# Patient Record
Sex: Female | Born: 2000 | Hispanic: No | Marital: Single | Smoking: Never smoker
Health system: Southern US, Community
[De-identification: ages and names within clinical notes are randomized; demographics above are authoritative.]

## PROBLEM LIST (undated history)

## (undated) DIAGNOSIS — J45909 Unspecified asthma, uncomplicated: Secondary | ICD-10-CM

## (undated) HISTORY — DX: Unspecified asthma, uncomplicated: J45.909

## (undated) HISTORY — PX: OTHER SURGICAL HISTORY: SHX169

---
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
## 2021-01-26 ENCOUNTER — Other Ambulatory Visit: Payer: Self-pay

## 2021-01-26 ENCOUNTER — Ambulatory Visit (INDEPENDENT_AMBULATORY_CARE_PROVIDER_SITE_OTHER): Payer: 59 | Admitting: Family Medicine

## 2021-01-26 ENCOUNTER — Encounter: Payer: Self-pay | Admitting: Family Medicine

## 2021-01-26 VITALS — BP 94/68 | HR 94 | Temp 98.0°F | Resp 16

## 2021-01-26 DIAGNOSIS — H1013 Acute atopic conjunctivitis, bilateral: Secondary | ICD-10-CM

## 2021-01-26 DIAGNOSIS — K219 Gastro-esophageal reflux disease without esophagitis: Secondary | ICD-10-CM | POA: Diagnosis not present

## 2021-01-26 DIAGNOSIS — J4540 Moderate persistent asthma, uncomplicated: Secondary | ICD-10-CM

## 2021-01-26 DIAGNOSIS — J302 Other seasonal allergic rhinitis: Secondary | ICD-10-CM

## 2021-01-26 DIAGNOSIS — J3089 Other allergic rhinitis: Secondary | ICD-10-CM | POA: Diagnosis not present

## 2021-01-26 DIAGNOSIS — H1010 Acute atopic conjunctivitis, unspecified eye: Secondary | ICD-10-CM

## 2021-01-26 MED ORDER — OMEPRAZOLE 20 MG PO CPDR: 20.0000 mg | DELAYED_RELEASE_CAPSULE | Freq: Every day | ORAL | 5 refills | Status: DC

## 2021-01-26 NOTE — Progress Notes (Signed)
[address] [place] [address] Dept: [phone]  FOLLOW UP NOTE  Patient ID: Christine Diaz, female    DOB: Jul 11, 2000  Age: 20 y.o. MRN: [id number] Date of Office Visit: 01/26/2021  Assessment  Chief Complaint: Sinusitis (Cat and dog exposure flaring allergies. Sinus pressure, sinus headache x 2 days. )  HPI Christine Diaz is a 20 year old female who presents to the clinic for evaluation of acute sinus issues.  She was last seen in this clinic on 10/26/2020 by Dr. Delorse Lek for evaluation of allergic rhinitis and asthma requiring a prednisone taper for relief of symptoms.  At today's visit, she reports that she returned home from college at [employer] on January 08, 2021 where her parents have pets in the home which aggravates her allergies and asthma.  She reports her asthma has been moderately well controlled with no shortness of breath or wheeze with activity or rest.  She does report occasional cough occurring in the daytime as well as overnight which is a mix of dry and producing clear mucus.  She continues montelukast 10 mg once a day and uses Symbicort 160 intermittently.  She has not used albuterol since her last visit to this clinic.  Allergic rhinitis is reported as poorly controlled with symptoms beginning shortly after she returned to her parents home including clear rhinorrhea, nasal congestion, sneezing, and postnasal drainage.  She continues a daily antihistamine, Flonase, and nasal saline rinses.  She does report headache above her left eye which resolved after taking an over-the-counter sinus medication.  Her last environmental allergy skin testing was on 02/19/2018 and was positive to grass pollen, weed pollen, tree pollen, cat, dog, and dust mites.  She is interested in possibly pursuing allergen immunotherapy.  She denies fever, sweats, chills, sore throat, and sick contacts.  Reflux is reported as poorly controlled with heartburn occurring most nights of the week.  She  reports that she had previously taken Prilosec with good control of reflux, however, is not currently taking this medication.  Her current medications are listed in the chart.   Drug Allergies:  No Known Allergies  Physical Exam: BP 94/68    Pulse 94    Temp 98 F (36.7 C) (Temporal)    Resp 16    SpO2 98%    Physical Exam Vitals reviewed.  Constitutional:      Appearance: Normal appearance.  HENT:     Head: Normocephalic and atraumatic.     Right Ear: Tympanic membrane normal.     Left Ear: Tympanic membrane normal.     Nose:     Comments: Bilateral nares slightly erythematous with clear nasal drainage noted.  Pharynx erythematous with no exudate.  Tonsils 3+ with no exudate.  Ears normal.  Eyes normal. Neurological:     Mental Status: She is alert.    Diagnostics: FVC 4.45, FEV1 4.00.  Predicted FVC 4.34, predicted FEV1 3.79.  Spirometry indicates normal ventilatory function.  Assessment and Plan: 1. Moderate persistent asthma without complication   2. Seasonal and perennial allergic rhinitis   3. Seasonal allergic conjunctivitis   4. Gastroesophageal reflux disease, unspecified whether esophagitis present     Meds ordered this encounter  Medications   omeprazole (PRILOSEC) 20 MG capsule    Sig: Take 1 capsule (20 mg total) by mouth daily.    Dispense:  30 capsule    Refill:  5    Patient Instructions  Asthma Continue Symbicort 160-2 puffs twice a day with a spacer  to prevent cough or wheeze Continue montelukast 10 mg once a day to help prevent cough and wheeze. Continue albuterol 2 puffs every 4 hours as needed for cough or wheeze You may use albuterol 2 puffs 5 to 15 minutes prior to exercise to decrease cough or wheeze  Allergic rhinitis Stop taking Zyrtec for the next 5 days.  Then continue Zyrtec 10 mg once a day for runny nose or itchy eyes. Remember to rotate to a different antihistamine about every 3 months. Some examples of over the counter antihistamines  include Zyrtec (cetirizine), Xyzal (levocetirizine), Allegra (fexofenadine), and Claritin (loratidine).  Prednisone 10 mg tablets. Take 2 tablets once a day for 4 days, then take 1 tablet on the 5th day, then stop.  Call the clinic with any worsening symptoms or if you develop a fever Continue fluticasone nasal spray doing 2 sprays each nostril once a day as needed for stuffy nose.  In the right nostril, point the applicator out toward the right ear. In the left nostril, point the applicator out toward the left ear Continue saline nasal rinses as needed for nasal symptoms. Use this before any medicated nasal sprays for best result If your medications do not control your symptoms consider a course of allergen immunotherapy.  Written information about allergy injections provided.  Call the clinic if you are interested in beginning allergen immunotherapy  Allergic conjunctivitis Some over the counter eye drops include Pataday one drop in each eye once a day as needed for red, itchy eyes OR Zaditor one drop in each eye twice a day as needed for red itchy eyes.  Reflux Continue dietary lifestyle modifications as listed below Begin omeprazole 20 mg once a day to prevent reflux or heartburn  Call the clinic if this treatment plan is not working well for you  Follow up in 3 months or sooner if needed.   Return in about 3 months (around 04/26/2021), or if symptoms worsen or fail to improve.    Thank you for the opportunity to care for this patient.  Please do not hesitate to contact me with questions.  Thermon Leyland, FNP [facility]

## 2021-01-26 NOTE — Patient Instructions (Addendum)
Asthma Continue Symbicort 160-2 puffs twice a day with a spacer to prevent cough or wheeze Continue montelukast 10 mg once a day to help prevent cough and wheeze. Continue albuterol 2 puffs every 4 hours as needed for cough or wheeze You may use albuterol 2 puffs 5 to 15 minutes prior to exercise to decrease cough or wheeze  Allergic rhinitis Stop taking Zyrtec for the next 5 days.  Then continue Zyrtec 10 mg once a day for runny nose or itchy eyes. Remember to rotate to a different antihistamine about every 3 months. Some examples of over the counter antihistamines include Zyrtec (cetirizine), Xyzal (levocetirizine), Allegra (fexofenadine), and Claritin (loratidine).  Prednisone 10 mg tablets. Take 2 tablets once a day for 4 days, then take 1 tablet on the 5th day, then stop.  Call the clinic with any worsening symptoms or if you develop a fever Continue fluticasone nasal spray doing 2 sprays each nostril once a day as needed for stuffy nose.  In the right nostril, point the applicator out toward the right ear. In the left nostril, point the applicator out toward the left ear Continue saline nasal rinses as needed for nasal symptoms. Use this before any medicated nasal sprays for best result If your medications do not control your symptoms consider a course of allergen immunotherapy.  Written information about allergy injections provided.  Call the clinic if you are interested in beginning allergen immunotherapy  Allergic conjunctivitis Some over the counter eye drops include Pataday one drop in each eye once a day as needed for red, itchy eyes OR Zaditor one drop in each eye twice a day as needed for red itchy eyes.  Reflux Continue dietary lifestyle modifications as listed below Begin omeprazole 20 mg once a day to prevent reflux or heartburn  Call the clinic if this treatment plan is not working well for you  Follow up in 3 months or sooner if needed.   Lifestyle Changes for Controlling  GERD When you have GERD, stomach acid feels as if its backing up toward your mouth. Whether or not you take medication to control your GERD, your symptoms can often be improved with lifestyle changes.   Raise Your Head Reflux is more likely to strike when youre lying down flat, because stomach fluid can flow backward more easily. Raising the head of your bed 4-6 inches can help. To do this: Slide blocks or books under the legs at the head of your bed. Or, place a wedge under the mattress. Many foam stores can make a suitable wedge for you. The wedge should run from your waist to the top of your head. Dont just prop your head on several pillows. This increases pressure on your stomach. It can make GERD worse.  Watch Your Eating Habits Certain foods may increase the acid in your stomach or relax the lower esophageal sphincter, making GERD more likely. Its best to avoid the following: Coffee, tea, and carbonated drinks (with and without caffeine) Fatty, fried, or spicy food Mint, chocolate, onions, and tomatoes Any other foods that seem to irritate your stomach or cause you pain  Relieve the Pressure Eat smaller meals, even if you have to eat more often. Dont lie down right after you eat. Wait a few hours for your stomach to empty. Avoid tight belts and tight-fitting clothes. Lose excess weight.  Tobacco and Alcohol Avoid smoking tobacco and drinking alcohol. They can make GERD symptoms worse.  Reducing Pollen Exposure The American Academy of Allergy,  Asthma and Immunology suggests the following steps to reduce your exposure to pollen during allergy seasons. Do not hang sheets or clothing out to dry; pollen may collect on these items. Do not mow lawns or spend time around freshly cut grass; mowing stirs up pollen. Keep windows closed at night.  Keep car windows closed while driving. Minimize morning activities outdoors, a time when pollen counts are usually at their  highest. Stay indoors as much as possible when pollen counts or humidity is high and on windy days when pollen tends to remain in the air longer. Use air conditioning when possible.  Many air conditioners have filters that trap the pollen spores. Use a HEPA room air filter to remove pollen form the indoor air you breathe.  Control of Dog or Cat Allergen Avoidance is the best way to manage a dog or cat allergy. If you have a dog or cat and are allergic to dog or cats, consider removing the dog or cat from the home. If you have a dog or cat but dont want to find it a new home, or if your family wants a pet even though someone in the household is allergic, here are some strategies that may help keep symptoms at bay:  Keep the pet out of your bedroom and restrict it to only a few rooms. Be advised that keeping the dog or cat in only one room will not limit the allergens to that room. Dont pet, hug or kiss the dog or cat; if you do, wash your hands with soap and water. High-efficiency particulate air (HEPA) cleaners run continuously in a bedroom or living room can reduce allergen levels over time. Regular use of a high-efficiency vacuum cleaner or a central vacuum can reduce allergen levels. Giving your dog or cat a bath at least once a week can reduce airborne allergen.  Control of Mold Allergen Mold and fungi can grow on a variety of surfaces provided certain temperature and moisture conditions exist.  Outdoor molds grow on plants, decaying vegetation and soil.  The major outdoor mold, Alternaria and Cladosporium, are found in very high numbers during hot and dry conditions.  Generally, a late Summer - Fall peak is seen for common outdoor fungal spores.  Rain will temporarily lower outdoor mold spore count, but counts rise rapidly when the rainy period ends.  The most important indoor molds are Aspergillus and Penicillium.  Dark, humid and poorly ventilated basements are ideal sites for mold growth.   The next most common sites of mold growth are the bathroom and the kitchen.  Outdoor [employer] Use air conditioning and keep windows closed Avoid exposure to decaying vegetation. Avoid leaf raking. Avoid grain handling. Consider wearing a face mask if working in moldy areas.  Indoor Mold Control Maintain humidity below 50%. Clean washable surfaces with 5% bleach solution. Remove sources e.g. Contaminated carpets.

## 2021-02-05 ENCOUNTER — Encounter: Payer: Self-pay | Admitting: Family Medicine

## 2021-02-05 ENCOUNTER — Ambulatory Visit (INDEPENDENT_AMBULATORY_CARE_PROVIDER_SITE_OTHER): Payer: 59 | Admitting: Family Medicine

## 2021-02-05 ENCOUNTER — Other Ambulatory Visit: Payer: Self-pay

## 2021-02-05 VITALS — BP 114/80 | HR 122 | Temp 98.2°F | Resp 20 | Ht 68.0 in | Wt 161.8 lb

## 2021-02-05 DIAGNOSIS — H1013 Acute atopic conjunctivitis, bilateral: Secondary | ICD-10-CM

## 2021-02-05 DIAGNOSIS — J3089 Other allergic rhinitis: Secondary | ICD-10-CM | POA: Diagnosis not present

## 2021-02-05 DIAGNOSIS — J0110 Acute frontal sinusitis, unspecified: Secondary | ICD-10-CM

## 2021-02-05 DIAGNOSIS — H1010 Acute atopic conjunctivitis, unspecified eye: Secondary | ICD-10-CM

## 2021-02-05 DIAGNOSIS — J4540 Moderate persistent asthma, uncomplicated: Secondary | ICD-10-CM | POA: Diagnosis not present

## 2021-02-05 DIAGNOSIS — K219 Gastro-esophageal reflux disease without esophagitis: Secondary | ICD-10-CM

## 2021-02-05 DIAGNOSIS — J302 Other seasonal allergic rhinitis: Secondary | ICD-10-CM

## 2021-02-05 MED ORDER — AMOXICILLIN-POT CLAVULANATE 875-125 MG PO TABS: 1.0000 | ORAL_TABLET | Freq: Two times a day (BID) | ORAL | 0 refills | Status: AC

## 2021-02-05 NOTE — Progress Notes (Signed)
[address]o [place] [address] Dept: [phone]  FOLLOW UP NOTE  Patient ID: Christine Diaz, female    DOB: 04/18/2000  Age: 21 y.o. MRN: [id number] Date of Office Visit: 02/05/2021  Assessment  Chief Complaint: Asthma (Exercise induced./ACT 24) and Allergic Rhinitis  (Yellow mucous expelled a lot of it. Sinus congestion. Here because it hasn't gotten better since the last visit.)  HPI Christine Diaz is a 21 year old female who presents to the clinic for a follow up visit. She was last seen in this clinic on 01/26/2021 for evaluation of asthma, allergic rhinitis, allergic conjunctivitis, and reflux.  At today's visit, she reports that she has continued to experience sinus symptoms since her last visit with symptoms worsening over the last several days including intermittent frontal headache, thick yellow to green nasal drainage, and nasal congestion.  She continues saline nasal rinses twice a day and Flonase daily with no improvement.  She reports that she has not been in contact with any animals for the last 5 days and her symptoms continue to worsen.  She reports that she gets 3-4 sinus infections a year for which she needs antibiotics for resolution of symptoms.  This is her third sinus infection since August 2022.  Her last environmental allergy skin testing was on 02/19/2018 and was positive to grass pollen, weed pollen, tree pollen, cat, dog, and dust mites.  Asthma is reported as well controlled with no shortness of breath, cough, or wheeze with activity or rest.  She continues montelukast 10 mg once a day, Symbicort 160-2 puffs twice a day and albuterol infrequently.  Reflux is reported as moderately well controlled with heartburn occurring several nights of the week for which she takes omeprazole with moderate relief of symptoms.  Her current medications are listed in the chart. Of note, implications of frequent antibiotic use including side effects and antibiotic resistance were  thoroughly discussed.  She is expressing some interest in allergen immunotherapy and is considering referral to an ENT specialist for evaluation of sinus anatomy.  Drug Allergies:  No Known Allergies  Physical Exam: BP 114/80    Pulse (!) 122    Temp 98.2 F (36.8 C) (Temporal)    Resp 20    Ht 5\' 8"  (1.727 m)    Wt 161 lb 12.8 oz (73.4 kg)    SpO2 98%    BMI 24.60 kg/m    Physical Exam Vitals reviewed.  Constitutional:      Appearance: Normal appearance.  HENT:     Head: Normocephalic and atraumatic.     Right Ear: Tympanic membrane normal.     Left Ear: Tympanic membrane normal.     Nose:     Comments: Bilateral nares slightly erythematous with clear nasal drainage noted.  Pharynx normal.  Ears normal.  Eyes normal.    Mouth/Throat:     Pharynx: Oropharynx is clear.  Eyes:     Conjunctiva/sclera: Conjunctivae normal.  Cardiovascular:     Rate and Rhythm: Normal rate and regular rhythm.     Heart sounds: Normal heart sounds. No murmur heard. Pulmonary:     Effort: Pulmonary effort is normal.     Breath sounds: Normal breath sounds.     Comments: Lungs clear to auscultation Musculoskeletal:        General: Normal range of motion.     Cervical back: Normal range of motion and neck supple.  Skin:    General: Skin is warm and dry.  Neurological:  Mental Status: She is alert and oriented to person, place, and time.  Psychiatric:        Mood and Affect: Mood normal.        Behavior: Behavior normal.        Thought Content: Thought content normal.        Judgment: Judgment normal.    Assessment and Plan: 1. Acute frontal sinusitis, recurrence not specified   2. Moderate persistent asthma without complication   3. Seasonal and perennial allergic rhinitis   4. Seasonal allergic conjunctivitis   5. Gastroesophageal reflux disease, unspecified whether esophagitis present     Meds ordered this encounter  Medications   amoxicillin-clavulanate (AUGMENTIN) 875-125 MG  tablet    Sig: Take 1 tablet by mouth 2 (two) times daily for 10 days.    Dispense:  20 tablet    Refill:  0    Patient Instructions  Acute sinusitis Begin Augmentin 875 mg tablets twice a day for the next 10 days, then stop Continue saline nasal rinses and Flonase daily Consider allergen immunotherapy Consider a referral to ENT for evaluation and treatment  Asthma Continue Symbicort 160-2 puffs twice a day with a spacer to prevent cough or wheeze Continue montelukast 10 mg once a day to help prevent cough and wheeze. Continue albuterol 2 puffs every 4 hours as needed for cough or wheeze You may use albuterol 2 puffs 5 to 15 minutes prior to exercise to decrease cough or wheeze  Allergic rhinitis Continue allergen avoidance measures directed toward pollens, cat, dog, and dust mites Continue Zyrtec 10 mg once a day for runny nose or itchy eyes. Remember to rotate to a different antihistamine about every 3 months. Some examples of over the counter antihistamines include Zyrtec (cetirizine), Xyzal (levocetirizine), Allegra (fexofenadine), and Claritin (loratidine).  Continue fluticasone nasal spray doing 2 sprays each nostril once a day as needed for stuffy nose.  In the right nostril, point the applicator out toward the right ear. In the left nostril, point the applicator out toward the left ear Continue saline nasal rinses as needed for nasal symptoms. Use this before any medicated nasal sprays for best result If your medications do not control your symptoms consider a course of allergen immunotherapy. Call the clinic if you are interested in beginning allergen immunotherapy  Allergic conjunctivitis Some over the counter eye drops include Pataday one drop in each eye once a day as needed for red, itchy eyes OR Zaditor one drop in each eye twice a day as needed for red itchy eyes.  Reflux Continue dietary lifestyle modifications as listed below Continue omeprazole 20 mg once a day to  prevent reflux or heartburn  Call the clinic if this treatment plan is not working well for you  Follow up in 3 months or sooner if needed.   Return in about 3 months (around 05/06/2021), or if symptoms worsen or fail to improve.    Thank you for the opportunity to care for this patient.  Please do not hesitate to contact me with questions.  Thermon Leyland, FNP [facility]

## 2021-02-05 NOTE — Patient Instructions (Addendum)
Acute sinusitis Begin Augmentin 875 mg tablets twice a day for the next 10 days, then stop Continue saline nasal rinses and Flonase daily Consider allergen immunotherapy Consider a referral to ENT for evaluation and treatment  Asthma Continue Symbicort 160-2 puffs twice a day with a spacer to prevent cough or wheeze Continue montelukast 10 mg once a day to help prevent cough and wheeze. Continue albuterol 2 puffs every 4 hours as needed for cough or wheeze You may use albuterol 2 puffs 5 to 15 minutes prior to exercise to decrease cough or wheeze  Allergic rhinitis Continue allergen avoidance measures directed toward pollens, cat, dog, and dust mites Continue Zyrtec 10 mg once a day for runny nose or itchy eyes. Remember to rotate to a different antihistamine about every 3 months. Some examples of over the counter antihistamines include Zyrtec (cetirizine), Xyzal (levocetirizine), Allegra (fexofenadine), and Claritin (loratidine).  Continue fluticasone nasal spray doing 2 sprays each nostril once a day as needed for stuffy nose.  In the right nostril, point the applicator out toward the right ear. In the left nostril, point the applicator out toward the left ear Continue saline nasal rinses as needed for nasal symptoms. Use this before any medicated nasal sprays for best result If your medications do not control your symptoms consider a course of allergen immunotherapy. Call the clinic if you are interested in beginning allergen immunotherapy  Allergic conjunctivitis Some over the counter eye drops include Pataday one drop in each eye once a day as needed for red, itchy eyes OR Zaditor one drop in each eye twice a day as needed for red itchy eyes.  Reflux Continue dietary lifestyle modifications as listed below Continue omeprazole 20 mg once a day to prevent reflux or heartburn  Call the clinic if this treatment plan is not working well for you  Follow up in 3 months or sooner if  needed.   Lifestyle Changes for Controlling GERD When you have GERD, stomach acid feels as if its backing up toward your mouth. Whether or not you take medication to control your GERD, your symptoms can often be improved with lifestyle changes.   Raise Your Head Reflux is more likely to strike when youre lying down flat, because stomach fluid can flow backward more easily. Raising the head of your bed 4-6 inches can help. To do this: Slide blocks or books under the legs at the head of your bed. Or, place a wedge under the mattress. Many foam stores can make a suitable wedge for you. The wedge should run from your waist to the top of your head. Dont just prop your head on several pillows. This increases pressure on your stomach. It can make GERD worse.  Watch Your Eating Habits Certain foods may increase the acid in your stomach or relax the lower esophageal sphincter, making GERD more likely. Its best to avoid the following: Coffee, tea, and carbonated drinks (with and without caffeine) Fatty, fried, or spicy food Mint, chocolate, onions, and tomatoes Any other foods that seem to irritate your stomach or cause you pain  Relieve the Pressure Eat smaller meals, even if you have to eat more often. Dont lie down right after you eat. Wait a few hours for your stomach to empty. Avoid tight belts and tight-fitting clothes. Lose excess weight.  Tobacco and Alcohol Avoid smoking tobacco and drinking alcohol. They can make GERD symptoms worse.  Reducing Pollen Exposure The American Academy of Allergy, Asthma and Immunology suggests the following  steps to reduce your exposure to pollen during allergy seasons. Do not hang sheets or clothing out to dry; pollen may collect on these items. Do not mow lawns or spend time around freshly cut grass; mowing stirs up pollen. Keep windows closed at night.  Keep car windows closed while driving. Minimize morning activities outdoors, a time when  pollen counts are usually at their highest. Stay indoors as much as possible when pollen counts or humidity is high and on windy days when pollen tends to remain in the air longer. Use air conditioning when possible.  Many air conditioners have filters that trap the pollen spores. Use a HEPA room air filter to remove pollen form the indoor air you breathe.  Control of Dog or Cat Allergen Avoidance is the best way to manage a dog or cat allergy. If you have a dog or cat and are allergic to dog or cats, consider removing the dog or cat from the home. If you have a dog or cat but dont want to find it a new home, or if your family wants a pet even though someone in the household is allergic, here are some strategies that may help keep symptoms at bay:  Keep the pet out of your bedroom and restrict it to only a few rooms. Be advised that keeping the dog or cat in only one room will not limit the allergens to that room. Dont pet, hug or kiss the dog or cat; if you do, wash your hands with soap and water. High-efficiency particulate air (HEPA) cleaners run continuously in a bedroom or living room can reduce allergen levels over time. Regular use of a high-efficiency vacuum cleaner or a central vacuum can reduce allergen levels. Giving your dog or cat a bath at least once a week can reduce airborne allergen.   Control of Dust Mite Allergen Dust mites play a major role in allergic asthma and rhinitis. They occur in environments with high humidity wherever human skin is found. Dust mites absorb humidity from the atmosphere (ie, they do not drink) and feed on organic matter (including shed human and animal skin). Dust mites are a microscopic type of insect that you cannot see with the naked eye. High levels of dust mites have been detected from mattresses, pillows, carpets, upholstered furniture, bed covers, clothes, soft toys and any woven material. The principal allergen of the dust mite is found in its  feces. A gram of dust may contain 1,000 mites and 250,000 fecal particles. Mite antigen is easily measured in the air during house cleaning activities. Dust mites do not bite and do not cause harm to humans, other than by triggering allergies/asthma.  Ways to decrease your exposure to dust mites in your home:  1. Encase mattresses, box springs and pillows with a mite-impermeable barrier or cover  2. Wash sheets, blankets and drapes weekly in hot water (130 F) with detergent and dry them in a dryer on the hot setting.  3. Have the room cleaned frequently with a vacuum cleaner and a damp dust-mop. For carpeting or rugs, vacuuming with a vacuum cleaner equipped with a high-efficiency particulate air (HEPA) filter. The dust mite allergic individual should not be in a room which is being cleaned and should wait 1 hour after cleaning before going into the room.  4. Do not sleep on upholstered furniture (eg, couches).  5. If possible removing carpeting, upholstered furniture and drapery from the home is ideal. Horizontal blinds should be eliminated in  the rooms where the person spends the most time (bedroom, study, television room). Washable vinyl, roller-type shades are optimal.  6. Remove all non-washable stuffed toys from the bedroom. Wash stuffed toys weekly like sheets and blankets above.  7. Reduce indoor humidity to less than 50%. Inexpensive humidity monitors can be purchased at most hardware stores. Do not use a humidifier as can make the problem worse and are not recommended.

## 2021-03-16 ENCOUNTER — Other Ambulatory Visit: Payer: Self-pay | Admitting: Family Medicine

## 2021-04-12 ENCOUNTER — Other Ambulatory Visit: Payer: Self-pay

## 2021-04-12 ENCOUNTER — Emergency Department (HOSPITAL_BASED_OUTPATIENT_CLINIC_OR_DEPARTMENT_OTHER): Payer: 59

## 2021-04-12 ENCOUNTER — Emergency Department (HOSPITAL_BASED_OUTPATIENT_CLINIC_OR_DEPARTMENT_OTHER)
Admission: EM | Admit: 2021-04-12 | Discharge: 2021-04-12 | Disposition: A | Discharge status: Home / Self Care | Payer: 59 | Attending: Emergency Medicine | Admitting: Emergency Medicine

## 2021-04-12 ENCOUNTER — Encounter (HOSPITAL_BASED_OUTPATIENT_CLINIC_OR_DEPARTMENT_OTHER): Payer: Self-pay | Admitting: Emergency Medicine

## 2021-04-12 DIAGNOSIS — R059 Cough, unspecified: Secondary | ICD-10-CM | POA: Diagnosis present

## 2021-04-12 DIAGNOSIS — R079 Chest pain, unspecified: Secondary | ICD-10-CM | POA: Insufficient documentation

## 2021-04-12 DIAGNOSIS — J45909 Unspecified asthma, uncomplicated: Secondary | ICD-10-CM | POA: Insufficient documentation

## 2021-04-12 DIAGNOSIS — J069 Acute upper respiratory infection, unspecified: Secondary | ICD-10-CM | POA: Diagnosis not present

## 2021-04-12 DIAGNOSIS — Z20822 Contact with and (suspected) exposure to covid-19: Secondary | ICD-10-CM | POA: Diagnosis not present

## 2021-04-12 DIAGNOSIS — Z7951 Long term (current) use of inhaled steroids: Secondary | ICD-10-CM | POA: Insufficient documentation

## 2021-04-12 DIAGNOSIS — R000 Tachycardia, unspecified: Secondary | ICD-10-CM | POA: Diagnosis not present

## 2021-04-12 LAB — GROUP A STREP BY PCR: Group A Strep by PCR: NOT DETECTED

## 2021-04-12 LAB — URINALYSIS, MICROSCOPIC (REFLEX)

## 2021-04-12 LAB — CBC WITH DIFFERENTIAL/PLATELET
Abs Immature Granulocytes: 0.08 10*3/uL — ABNORMAL HIGH (ref 0.00–0.07)
Basophils Absolute: 0 10*3/uL (ref 0.0–0.1)
Basophils Relative: 0 %
Eosinophils Absolute: 0.1 10*3/uL (ref 0.0–0.5)
Eosinophils Relative: 0 %
HCT: 40.7 % (ref 36.0–46.0)
Hemoglobin: 13.7 g/dL (ref 12.0–15.0)
Immature Granulocytes: 1 %
Lymphocytes Relative: 7 %
Lymphs Abs: 1 10*3/uL (ref 0.7–4.0)
MCH: 30.4 pg (ref 26.0–34.0)
MCHC: 33.7 g/dL (ref 30.0–36.0)
MCV: 90.4 fL (ref 80.0–100.0)
Monocytes Absolute: 1.1 10*3/uL — ABNORMAL HIGH (ref 0.1–1.0)
Monocytes Relative: 8 %
Neutro Abs: 11.9 10*3/uL — ABNORMAL HIGH (ref 1.7–7.7)
Neutrophils Relative %: 84 %
Platelets: 254 10*3/uL (ref 150–400)
RBC: 4.5 MIL/uL (ref 3.87–5.11)
RDW: 12.8 % (ref 11.5–15.5)
WBC: 14.1 10*3/uL — ABNORMAL HIGH (ref 4.0–10.5)
nRBC: 0 % (ref 0.0–0.2)

## 2021-04-12 LAB — MONONUCLEOSIS SCREEN: Mono Screen: NEGATIVE

## 2021-04-12 LAB — COMPREHENSIVE METABOLIC PANEL
ALT: 38 U/L (ref 0–44)
AST: 19 U/L (ref 15–41)
Albumin: 4.2 g/dL (ref 3.5–5.0)
Alkaline Phosphatase: 54 U/L (ref 38–126)
Anion gap: 8 (ref 5–15)
BUN: 9 mg/dL (ref 6–20)
CO2: 23 mmol/L (ref 22–32)
Calcium: 9.2 mg/dL (ref 8.9–10.3)
Chloride: 101 mmol/L (ref 98–111)
Creatinine, Ser: 1.05 mg/dL — ABNORMAL HIGH (ref 0.44–1.00)
GFR, Estimated: >60 mL/min (ref >60)
Glucose, Bld: 87 mg/dL (ref 70–99)
Potassium: 4 mmol/L (ref 3.5–5.1)
Sodium: 132 mmol/L — ABNORMAL LOW (ref 135–145)
Total Bilirubin: 0.6 mg/dL (ref 0.3–1.2)
Total Protein: 8.2 g/dL — ABNORMAL HIGH (ref 6.5–8.1)

## 2021-04-12 LAB — URINALYSIS, ROUTINE W REFLEX MICROSCOPIC
Bilirubin Urine: NEGATIVE
Glucose, UA: NEGATIVE mg/dL
Hgb urine dipstick: NEGATIVE
Ketones, ur: 15 mg/dL — AB
Leukocytes,Ua: NEGATIVE
Nitrite: NEGATIVE
Protein, ur: 30 mg/dL — AB
Specific Gravity, Urine: 1.02 (ref 1.005–1.030)
pH: 7 (ref 5.0–8.0)

## 2021-04-12 LAB — RESP PANEL BY RT-PCR (FLU A&B, COVID) ARPGX2
Influenza A by PCR: NEGATIVE
Influenza B by PCR: NEGATIVE
SARS Coronavirus 2 by RT PCR: NEGATIVE

## 2021-04-12 LAB — HCG, SERUM, QUALITATIVE: Preg, Serum: NEGATIVE

## 2021-04-12 MED ORDER — LACTATED RINGERS IV BOLUS
1000.0000 mL | Freq: Once | INTRAVENOUS | Status: AC
  Administered 2021-04-12: 1000 mL via INTRAVENOUS

## 2021-04-12 MED ORDER — IBUPROFEN 400 MG PO TABS
600.0000 mg | ORAL_TABLET | Freq: Once | ORAL | Status: AC
  Administered 2021-04-12: 600 mg via ORAL
  Filled 2021-04-12: qty 1

## 2021-04-12 MED ORDER — DOXYCYCLINE HYCLATE 100 MG PO CAPS: 100.0000 mg | ORAL_CAPSULE | Freq: Two times a day (BID) | ORAL | 0 refills | Status: DC

## 2021-04-12 MED ORDER — VENTOLIN HFA 108 (90 BASE) MCG/ACT IN AERS: 2.0000 | INHALATION_SPRAY | RESPIRATORY_TRACT | 1 refills | Status: DC | PRN

## 2021-04-12 MED ORDER — IPRATROPIUM-ALBUTEROL 0.5-2.5 (3) MG/3ML IN SOLN
3.0000 mL | Freq: Once | RESPIRATORY_TRACT | Status: AC
  Administered 2021-04-12: 3 mL via RESPIRATORY_TRACT
  Filled 2021-04-12: qty 3

## 2021-04-12 MED ORDER — ACETAMINOPHEN 325 MG PO TABS
650.0000 mg | ORAL_TABLET | Freq: Once | ORAL | Status: AC
  Administered 2021-04-12: 650 mg via ORAL
  Filled 2021-04-12: qty 2

## 2021-04-12 MED ORDER — LACTATED RINGERS IV BOLUS
1000.0000 mL | Freq: Once | INTRAVENOUS | Status: AC
Start: 2021-04-12 — End: 2021-04-12
  Administered 2021-04-12: 1000 mL via INTRAVENOUS

## 2021-04-12 NOTE — Discharge Instructions (Addendum)
You were seen here today for evaluation of your cough and cold symptoms. Your CT imaging was normal. Your chest xray was normal. Your vital signs improved after antipyretics and fluids. I have prescribed you doxycycline and given you a refill of you albuterol inhaler. You can take OTC medications like Robitussin for cough. Be careful when combining Tylenol/ibuprofen with OTC cough and cold medications as these medications can already include these active ingredients. Please consult a pharmacist for any questions on this. I have also included the information for a PCP clinic for you to call so schedule an appointment if needed while on breaks from school, or during the summer.  ? ?Contact a doctor if: ?You are getting worse, not better. ?You have any of these: ?A fever or chills. ?Brown or red mucus in your nose. ?Yellow or brown fluid (discharge)coming from your nose. ?Pain in your face, especially when you bend forward. ?Swollen neck glands. ?Pain when you swallow. ?White areas in the back of your throat. ?Get help right away if: ?You have shortness of breath that gets worse. ?You have very bad or constant: ?Headache. ?Ear pain. ?Pain in your forehead, behind your eyes, and over your cheekbones (sinus pain). ?Chest pain. ?You have long-lasting (chronic) lung disease along with any of these: ?Making high-pitched whistling sounds when you breathe, most often when you breathe out (wheezing). ?Long-lasting cough (more than 14 days). ?Coughing up blood. ?A change in your usual mucus. ?You have a stiff neck. ?You have changes in your: ?Vision. ?Hearing. ?Thinking. ?Mood. ?Get help right away if: ?Your symptoms get worse. ?You develop any new symptoms. ?You have loss of feeling (numbness) or weakness in any part of your body. ?You have trouble walking or moving. ?You have problems with speech, understanding, or vision. ?You have a seizure. ?You lose consciousness. ?

## 2021-04-12 NOTE — ED Triage Notes (Signed)
Pt reports cough , fever x 4 days . Tested negative for flu and rapid covid 2 days ago . Nausea and vomiting.  ?Chest pain when coughing and shortness of breath   ?  ?

## 2021-04-12 NOTE — ED Provider Notes (Incomplete)
[facility] EMERGENCY DEPARTMENT Provider Note   CSN: [id number] Arrival date & time: 04/12/21  1052     History  Chief Complaint  Patient presents with   Cough    Christine Diaz is a 21 y.o. female with history of exercise-induced asthma and GERD presents emergency department for evaluation cough and cold symptoms for the past 4 days.  She is coming by her mother who reports the patient's had between 100-102F fever.  She reports the patient is also had a cough, headache, sore throat, chest pain with coughing, nasal congestion, rhinorrhea, and vomiting.  The patient reports that she only had the vomiting on Saturday but is not had any since.  She denies any nausea, abdominal pain, diarrhea, or constipation.  Denies any dysuria or hematuria.  She reports a decrease in appetite but she is still able to eat and drink.  LMP 3 weeks ago.  She denies any surgeries.  Medication include Singulair, Claritin, OCP, and Pepcid.  Denies any tobacco, EtOH, or drug use ever.  No known drug allergies.   Cough Associated symptoms: chest pain, chills, fever, headaches, rhinorrhea and sore throat   Associated symptoms: no ear pain, no myalgias, no rash and no shortness of breath       Home Medications Prior to Admission medications   Medication Sig Start Date End Date Taking? Authorizing Provider  budesonide-formoterol (SYMBICORT) 160-4.5 MCG/ACT inhaler Inhale 2 puffs into the lungs 2 (two) times daily. 04/21/20   Dara Hoyer, FNP  fluticasone (FLONASE) 50 MCG/ACT nasal spray 1 spray per nostril twice daily if needed for stuffy nose 02/04/19   Charlies Silvers, MD  loratadine (CLARITIN) 10 MG tablet Take 10 mg by mouth daily as needed for allergies.    [provider]  montelukast (SINGULAIR) 10 MG tablet TAKE ONE TABLET BY MOUTH AT BEDTIME 03/16/21   Ambs, Kathrine Cords, FNP  norethindrone-ethinyl estradiol (LOESTRIN FE) 1-20 MG-MCG tablet Take 1 tablet by mouth daily. 02/26/20   [provider]  olopatadine (PATANOL) 0.1 % ophthalmic solution Place 1 drop into both eyes 2 (two) times daily as needed for allergies. 04/30/18   Valentina Shaggy, MD  omeprazole (PRILOSEC) 20 MG capsule Take 1 capsule (20 mg total) by mouth daily. 01/26/21   Ambs, Kathrine Cords, FNP  VENTOLIN HFA 108 (90 Base) MCG/ACT inhaler Inhale 2 puffs into the lungs every 4 (four) hours as needed. 02/04/19   Charlies Silvers, MD      Allergies    Patient has no known allergies.    Review of Systems   Review of Systems  Constitutional:  Positive for chills and fever.  HENT:  Positive for congestion, rhinorrhea and sore throat. Negative for ear pain and trouble swallowing.   Respiratory:  Positive for cough. Negative for shortness of breath.   Cardiovascular:  Positive for chest pain.  Gastrointestinal:  Positive for vomiting. Negative for abdominal pain, constipation, diarrhea and nausea.  Genitourinary:  Negative for dysuria and hematuria.  Musculoskeletal:  Negative for myalgias.  Skin:  Negative for rash.  Neurological:  Positive for headaches. Negative for weakness.   Physical Exam Updated Vital Signs BP 115/87    Pulse (!) 135    Temp 99.6 F (37.6 C) (Oral)    Resp 16    Wt 72.6 kg    SpO2 100%    BMI 24.33 kg/m  Physical Exam Vitals and nursing note reviewed.  Constitutional:      General: She  is not in acute distress.    Appearance: She is ill-appearing. She is not toxic-appearing.     Comments: Patient coughing during exam  HENT:     Head: Normocephalic and atraumatic.     Right Ear: Tympanic membrane, ear canal and external ear normal.     Left Ear: Tympanic membrane, ear canal and external ear normal.     Nose:     Comments: Bilateral nasal turbinate edema and erythema with scant clear nasal discharge.    Mouth/Throat:     Mouth: Mucous membranes are moist.     Comments: Mild pharyngeal erythema without edema.  Tonsils 2+ with some exudate noted.  Controlling secretions well.   Uvula midline.  No soft palate deviation.  Airway patent.  Moist his membranes. Eyes:     General: No scleral icterus.    Conjunctiva/sclera: Conjunctivae normal.  Cardiovascular:     Rate and Rhythm: Regular rhythm. Tachycardia present.  Pulmonary:     Effort: Pulmonary effort is normal. No respiratory distress.     Breath sounds: Normal breath sounds.  Abdominal:     General: Abdomen is flat. Bowel sounds are normal.     Palpations: Abdomen is soft.     Tenderness: There is no abdominal tenderness. There is no guarding or rebound.  Musculoskeletal:        General: No deformity.     Cervical back: Normal range of motion. No rigidity or tenderness.  Lymphadenopathy:     Cervical: No cervical adenopathy.  Skin:    General: Skin is warm and dry.     Findings: No rash.  Neurological:     General: No focal deficit present.     Mental Status: She is alert. Mental status is at baseline.    ED Results / Procedures / Treatments   Labs (all labs ordered are listed, but only abnormal results are displayed) Labs Reviewed  CBC WITH DIFFERENTIAL/PLATELET - Abnormal; Notable for the following components:      Result Value   WBC 14.1 (*)    Neutro Abs 11.9 (*)    Monocytes Absolute 1.1 (*)    Abs Immature Granulocytes 0.08 (*)    All other components within normal limits  RESP PANEL BY RT-PCR (FLU A&B, COVID) [id number]  GROUP A STREP BY PCR  MONONUCLEOSIS SCREEN  COMPREHENSIVE METABOLIC PANEL  URINALYSIS, ROUTINE W REFLEX MICROSCOPIC  HCG, SERUM, QUALITATIVE    EKG None  Radiology DG Chest 2 View  Result Date: 04/12/2021 CLINICAL DATA:  Cough, fever and congestion. EXAM: CHEST - 2 VIEW COMPARISON:  None. FINDINGS: Trachea is midline. Heart size normal. Lungs are clear. No pleural fluid. IMPRESSION: No acute findings. Electronically Signed   By: Lorin Picket M.D.   On: 04/12/2021 11:31    Procedures Procedures   Medications Ordered in ED Medications  lactated ringers bolus  1,000 mL (1,000 mLs Intravenous New Bag/Given 04/12/21 1255)  ipratropium-albuterol (DUONEB) 0.5-2.5 (3) MG/3ML nebulizer solution 3 mL (3 mLs Nebulization Given 04/12/21 1233)  ibuprofen (ADVIL) tablet 600 mg (600 mg Oral Given 04/12/21 1300)    ED Course/ Medical Decision Making/ A&P Clinical Course as of 04/12/21 1602  Mon Apr 12, 2021  1535 This is a 21 year old female presenting to ED in the company of her mother with concern for cough, headache, congestion.  Patient reports that she has been treated for "sinusitis" empirically with 3 courses of antibiotics in the past 3 months, including Augmentin twice and penicillin once.  She continues  to have intermittent frontal headaches.  She says she would often get headaches even before this started.  Her current headache is frontal and located behind both of her eyes.  She denies neck stiffness, photophobia.  Her headache does respond to Advil at home.  She is also had a persistent cough for several days or weeks, reports generalized fatigue.  Her mother is concerned that the patient has been chronically sick since she arrived at [employer] for college 1 year ago, and questions whether the patient may be reacting to "mold" in her dorm department.  On my exam the patient was initially febrile, tachycardic, clinically well-appearing, cognitively sharp, no photophobia, no nuchal rigidity.  She does have some tachycardia but her lungs are clear to auscultation.  She has a benign abdominal exam with no focal tenderness to suggest acute intra-abdominal process.  She does not have a rash.  She does have a dry cough.  She does have some sinus tenderness around the frontal sinuses [MT]  1545 Her blood test showed a leukocytosis of 14.1, but the patient reports that she just finished her prednisone course several days ago, this may be responsive to prednisone.  Her COVID and flu tests are negative.  Her monoscreen is negative.  Her UA does show some ketones and protein  suggestive of some mild dehydration, but negative leukocytes and nitrites.  The patient was given IV fluids and ibuprofen and reports that she felt much better after the fluids. [MT]  53 I suspect he most likely continues to have a viral syndrome, but discussed with mother and the patient possible observation for bacteremia rule out.  I do not see any other clear source for bacterial infection otherwise.  She would prefer to go home.  We will send blood cultures, can start empirically on 7 days of doxycycline until we have preliminary results from her blood cultures.  Strict return precautions were discussed with the patient and her mother, including worsening headache, confusion, lethargy, strokelike symptoms, loss of consciousness.  They verbalized understanding and agreement the plan [MT]    Clinical Course User Index [MT] Trifan, Carola Rhine, MD                           Medical Decision Making Amount and/or Complexity of Data Reviewed Labs: ordered. Radiology: ordered.  Risk OTC drugs. Prescription drug management.   21 year old female presents emerged department for evaluation of cough and cold symptoms for the past 3 to 4 days.  Differential diagnosis includes but is not limited to COVID, flu, strep throat, mono, meningitis, pregnancy, viral illness, PTA.  Vital signs show tachycardia and elevated temperature.  Normotensive, satting well on room air without any increased work of breathing.  Physical exam is notable for some pharyngeal erythema with bilateral exudate.  Uvula is midline.  No soft palate deviation.  Airway patent.  Patient speaking in full sentences.  Patient is tachycardic.  Clear lung sounds.  Nontender abdomen.  Will order fluids for her tachycardia and basic lab work including a monotest.  Ordered DuoNeb for patient's coughing.  I rechecked the patient's temperature personally in the room and it was 100.7.  Ibuprofen ordered.  I independently reviewed and interpreted the  patient's labs imaging and agree with radiologist interpretation.  ECG shows elevated white blood cell count of 14.1 with a left shift.  No signs of anemia.  hCG negative.  Negative for COVID and flu.  Strep***.  Mono***.  CMP shows***.  Urinalysis shows***.    {Document critical care time when appropriate:1} {Document review of labs and clinical decision tools ie heart score, Chads2Vasc2 etc:1}  {Document your independent review of radiology images, and any outside records:1} {Document your discussion with family members, caretakers, and with consultants:1} {Document social determinants of health affecting pt's care:1} {Document your decision making why or why not admission, treatments were needed:1} Final Clinical Impression(s) / ED Diagnoses Final diagnoses:  None    Rx / DC Orders ED Discharge Orders     None

## 2021-04-14 ENCOUNTER — Emergency Department (HOSPITAL_BASED_OUTPATIENT_CLINIC_OR_DEPARTMENT_OTHER): Payer: 59

## 2021-04-14 ENCOUNTER — Emergency Department (HOSPITAL_BASED_OUTPATIENT_CLINIC_OR_DEPARTMENT_OTHER)
Admission: EM | Admit: 2021-04-14 | Discharge: 2021-04-14 | Disposition: A | Discharge status: Home / Self Care | Payer: 59 | Attending: Emergency Medicine | Admitting: Emergency Medicine

## 2021-04-14 ENCOUNTER — Other Ambulatory Visit: Payer: Self-pay

## 2021-04-14 ENCOUNTER — Encounter (HOSPITAL_BASED_OUTPATIENT_CLINIC_OR_DEPARTMENT_OTHER): Payer: Self-pay | Admitting: Emergency Medicine

## 2021-04-14 DIAGNOSIS — R059 Cough, unspecified: Secondary | ICD-10-CM | POA: Diagnosis present

## 2021-04-14 DIAGNOSIS — Z7952 Long term (current) use of systemic steroids: Secondary | ICD-10-CM | POA: Diagnosis not present

## 2021-04-14 DIAGNOSIS — Z7951 Long term (current) use of inhaled steroids: Secondary | ICD-10-CM | POA: Insufficient documentation

## 2021-04-14 DIAGNOSIS — J039 Acute tonsillitis, unspecified: Secondary | ICD-10-CM | POA: Diagnosis not present

## 2021-04-14 LAB — CBC WITH DIFFERENTIAL/PLATELET
Abs Immature Granulocytes: 0.04 10*3/uL (ref 0.00–0.07)
Basophils Absolute: 0 10*3/uL (ref 0.0–0.1)
Basophils Relative: 0 %
Eosinophils Absolute: 0.1 10*3/uL (ref 0.0–0.5)
Eosinophils Relative: 2 %
HCT: 35.8 % — ABNORMAL LOW (ref 36.0–46.0)
Hemoglobin: 12.1 g/dL (ref 12.0–15.0)
Immature Granulocytes: 1 %
Lymphocytes Relative: 14 %
Lymphs Abs: 1.1 10*3/uL (ref 0.7–4.0)
MCH: 30.3 pg (ref 26.0–34.0)
MCHC: 33.8 g/dL (ref 30.0–36.0)
MCV: 89.5 fL (ref 80.0–100.0)
Monocytes Absolute: 0.8 10*3/uL (ref 0.1–1.0)
Monocytes Relative: 10 %
Neutro Abs: 6.1 10*3/uL (ref 1.7–7.7)
Neutrophils Relative %: 73 %
Platelets: 235 10*3/uL (ref 150–400)
RBC: 4 MIL/uL (ref 3.87–5.11)
RDW: 12.7 % (ref 11.5–15.5)
WBC: 8.3 10*3/uL (ref 4.0–10.5)
nRBC: 0 % (ref 0.0–0.2)

## 2021-04-14 LAB — BASIC METABOLIC PANEL
Anion gap: 8 (ref 5–15)
BUN: 7 mg/dL (ref 6–20)
CO2: 23 mmol/L (ref 22–32)
Calcium: 9.2 mg/dL (ref 8.9–10.3)
Chloride: 102 mmol/L (ref 98–111)
Creatinine, Ser: 0.93 mg/dL (ref 0.44–1.00)
GFR, Estimated: >60 mL/min (ref >60)
Glucose, Bld: 106 mg/dL — ABNORMAL HIGH (ref 70–99)
Potassium: 4.1 mmol/L (ref 3.5–5.1)
Sodium: 133 mmol/L — ABNORMAL LOW (ref 135–145)

## 2021-04-14 LAB — HCG, SERUM, QUALITATIVE: Preg, Serum: NEGATIVE

## 2021-04-14 MED ORDER — IBUPROFEN 200 MG PO TABS
600.0000 mg | ORAL_TABLET | Freq: Once | ORAL | Status: AC
  Administered 2021-04-14: 600 mg via ORAL
  Filled 2021-04-14: qty 1

## 2021-04-14 MED ORDER — SODIUM CHLORIDE 0.9 % IV BOLUS
1000.0000 mL | Freq: Once | INTRAVENOUS | Status: AC
  Administered 2021-04-14: 1000 mL via INTRAVENOUS

## 2021-04-14 MED ORDER — CLINDAMYCIN HCL 150 MG PO CAPS: 300.0000 mg | ORAL_CAPSULE | Freq: Three times a day (TID) | ORAL | 0 refills | Status: AC

## 2021-04-14 MED ORDER — PREDNISONE 20 MG PO TABS: ORAL_TABLET | ORAL | 0 refills | Status: DC

## 2021-04-14 MED ORDER — DEXAMETHASONE SODIUM PHOSPHATE 10 MG/ML IJ SOLN
20.0000 mg | Freq: Once | INTRAMUSCULAR | Status: AC
Start: 2021-04-14 — End: 2021-04-14
  Administered 2021-04-14: 20 mg via INTRAVENOUS
  Filled 2021-04-14: qty 2

## 2021-04-14 MED ORDER — IOHEXOL 300 MG/ML  SOLN
75.0000 mL | Freq: Once | INTRAMUSCULAR | Status: AC | PRN
  Administered 2021-04-14: 75 mL via INTRAVENOUS

## 2021-04-14 NOTE — ED Triage Notes (Signed)
Seen on Monday.  Per mom still running fevers and not feeling any better.  Continues to have cough, sore throat, laryngitis, and fever. ?

## 2021-04-14 NOTE — Discharge Instructions (Addendum)
It was a pleasure taking care of you today! ? ?Your labs were unremarkable today. Your CT scan showed tonsillitis without abscess. You will be sent a prescription for prednisone, take as directed. You may continue taking over the counter 600 mg ibuprofen or 500 mg tylenol every 6 hours as needed for pain for no more than 7 days.  Discontinue taking the doxycycline.  You have been sent a prescription for clindamycin, take as prescribed.  You may follow up with your primary care provider as needed. Attached is information for the on-call ENT specialist, call and set up a follow up appointment regarding todays ED visit. Return to the Emergency Department if you are experiencing increasing/worsening trouble breathing, fever, vomiting, or worsening symptoms. ? ?

## 2021-04-14 NOTE — ED Provider Notes (Signed)
?[facility] EMERGENCY DEPARTMENT ?Provider Note ? ? ?CSN: [id number] ?Arrival date & time: 04/14/21  1105 ? ?  ? ?History ? ?Chief Complaint  ?Patient presents with  ? Sore Throat  ? Cough  ? ? ?Christine Diaz is a 21 y.o. female who presents to the ED complaining of sore throat onset 6 days. Mother notes that patient has associated hoarse voice, fever, cough, sore throat, painful swallowing. Has tried Rx doxycycline and OTC tylenol and ibuprofen without relief of her symptoms.  ? ? ?Per patient chart review: Pt was evaluated in the ED on 04/12/2021 for similar concerns and had a negative workup (CT maxillofacial, chest x-ray, urinalysis, group A strep, beta-hCG, and CMP, CBC, mononucleosis, COVID and flu swab) at that time.  ? ?The history is provided by the patient and a parent. No language interpreter was used.  ? ?  ? ?Home Medications ?Prior to Admission medications   ?Medication Sig Start Date End Date Taking? Authorizing Provider  ?clindamycin (CLEOCIN) 150 MG capsule Take 2 capsules (300 mg total) by mouth 3 (three) times daily for 10 days. 04/14/21 04/24/21 Yes Rorey Bisson A, PA-C  ?predniSONE (DELTASONE) 20 MG tablet 3 tabs po day one, then 2 tabs daily x 4 days 04/14/21  Yes Glorene Leitzke A, PA-C  ?budesonide-formoterol (SYMBICORT) 160-4.5 MCG/ACT inhaler Inhale 2 puffs into the lungs 2 (two) times daily. 04/21/20   Hetty BlendAmbs, Anne M, FNP  ?doxycycline (VIBRAMYCIN) 100 MG capsule Take 1 capsule (100 mg total) by mouth 2 (two) times daily. 04/12/21   Achille Richansom, Riley, PA-C  ?fluticasone (FLONASE) 50 MCG/ACT nasal spray 1 spray per nostril twice daily if needed for stuffy nose 02/04/19   Fletcher AnonBardelas, Jose A, MD  ?loratadine (CLARITIN) 10 MG tablet Take 10 mg by mouth daily as needed for allergies.    [provider]  ?montelukast (SINGULAIR) 10 MG tablet TAKE ONE TABLET BY MOUTH AT BEDTIME 03/16/21   Ambs, Norvel RichardsAnne M, FNP  ?norethindrone-ethinyl estradiol (LOESTRIN FE) 1-20 MG-MCG tablet Take 1 tablet by mouth  daily. 02/26/20   [provider]  ?olopatadine (PATANOL) 0.1 % ophthalmic solution Place 1 drop into both eyes 2 (two) times daily as needed for allergies. 04/30/18   Alfonse SpruceGallagher, Joel Louis, MD  ?omeprazole (PRILOSEC) 20 MG capsule Take 1 capsule (20 mg total) by mouth daily. 01/26/21   Hetty BlendAmbs, Anne M, FNP  ?VENTOLIN HFA 108 (90 Base) MCG/ACT inhaler Inhale 2 puffs into the lungs every 4 (four) hours as needed. 04/12/21   Achille Richansom, Riley, PA-C  ?   ? ?Allergies    ?Patient has no known allergies.   ? ?Review of Systems   ?Review of Systems  ?Constitutional:  Positive for chills and fever.  ?HENT:  Positive for sore throat. Negative for trouble swallowing.   ?Respiratory:  Positive for cough.   ?Gastrointestinal:  Negative for abdominal pain, nausea and vomiting.  ?Skin:  Negative for rash.  ?All other systems reviewed and are negative. ? ?Physical Exam ?Updated Vital Signs ?BP 123/73 (BP Location: Left Arm)   Pulse (!) 101   Temp 99.9 ?F (37.7 ?C) (Oral)   Resp 18   Ht 5\' 8"  (1.727 m)   Wt 74.8 kg   LMP 03/23/2021   SpO2 99%   BMI 25.09 kg/m?  ?Physical Exam ?Vitals and nursing note reviewed.  ?Constitutional:   ?   General: She is not in acute distress. ?   Appearance: She is not diaphoretic.  ?HENT:  ?   Head:  Normocephalic and atraumatic.  ?   Mouth/Throat:  ?   Mouth: Mucous membranes are moist.  ?   Pharynx: Oropharynx is clear. Uvula midline. Posterior oropharyngeal erythema present. No oropharyngeal exudate or uvula swelling.  ?   Tonsils: Tonsillar exudate present.  ?   Comments: Symmetric kissing tonsils with tonsillar exudate and erythema noted bilaterally. Uvula midline without swelling.  ?Eyes:  ?   General: No scleral icterus. ?   Conjunctiva/sclera: Conjunctivae normal.  ?Cardiovascular:  ?   Rate and Rhythm: Normal rate and regular rhythm.  ?   Pulses: Normal pulses.  ?   Heart sounds: Normal heart sounds.  ?Pulmonary:  ?   Effort: Pulmonary effort is normal. No respiratory distress.  ?    Breath sounds: Normal breath sounds. No wheezing.  ?Abdominal:  ?   General: Bowel sounds are normal.  ?   Palpations: Abdomen is soft. There is no mass.  ?   Tenderness: There is no abdominal tenderness. There is no guarding or rebound.  ?Musculoskeletal:     ?   General: Normal range of motion.  ?   Cervical back: Normal range of motion and neck supple.  ?Skin: ?   General: Skin is warm and dry.  ?Neurological:  ?   Mental Status: She is alert.  ?Psychiatric:     ?   Behavior: Behavior normal.  ? ? ?ED Results / Procedures / Treatments   ?Labs ?(all labs ordered are listed, but only abnormal results are displayed) ?Labs Reviewed  ?BASIC METABOLIC PANEL - Abnormal; Notable for the following components:  ?    Result Value  ? Sodium 133 (*)   ? Glucose, Bld 106 (*)   ? All other components within normal limits  ?CBC WITH DIFFERENTIAL/PLATELET - Abnormal; Notable for the following components:  ? HCT 35.8 (*)   ? All other components within normal limits  ?HCG, SERUM, QUALITATIVE  ? ? ?EKG ?None ? ?Radiology ?CT Soft Tissue Neck W Contrast ? ?Result Date: 04/14/2021 ?CLINICAL DATA:  Epiglottitis or tonsillitis suspected EXAM: CT NECK WITH CONTRAST TECHNIQUE: Multidetector CT imaging of the neck was performed using the standard protocol following the bolus administration of intravenous contrast. RADIATION DOSE REDUCTION: This exam was performed according to the departmental dose-optimization program which includes automated exposure control, adjustment of the mA and/or kV according to patient size and/or use of iterative reconstruction technique. CONTRAST:  37mL OMNIPAQUE IOHEXOL 300 MG/ML  SOLN COMPARISON:  None. FINDINGS: Pharynx and larynx: Enlarged/edematous palatine tonsils with striated enhancement, compatible with tonsillitis. Adenoid hypertrophy. Normal appearance of the epiglottis and larynx. Salivary glands: No inflammation, mass, or stone. Thyroid: Normal. Lymph nodes: Enlarged upper cervical chain lymph nodes  bilaterally. Vascular: Limited evaluation due to non arterial timing. Major arteries appear to be grossly patent in the neck. Limited intracranial: Negative. Visualized orbits: Negative. Mastoids and visualized paranasal sinuses: Clear. Skeleton: No evidence of acute abnormality. Upper chest: Visualized lung apices are clear. IMPRESSION: 1. Enlarged/edematous palatine tonsils with striated enhancement, compatible with tonsillitis. No discrete, drainable fluid collection. 2. Large upper cervical chain nodes bilaterally, nonspecific but probably reactive given above findings. Electronically Signed   By: Feliberto Harts M.D.   On: 04/14/2021 13:31   ? ?Procedures ?Procedures  ? ? ?Medications Ordered in ED ?Medications  ?dexamethasone (DECADRON) injection 20 mg (20 mg Intravenous Given 04/14/21 1256)  ?ibuprofen (ADVIL) tablet 600 mg (600 mg Oral Given 04/14/21 1246)  ?sodium chloride 0.9 % bolus 1,000 mL ( Intravenous Stopped  04/14/21 1420)  ?iohexol (OMNIPAQUE) 300 MG/ML solution 75 mL (75 mLs Intravenous Contrast Given 04/14/21 1306)  ? ? ?ED Course/ Medical Decision Making/ A&P ?Clinical Course as of 04/15/21 0924  ?Wed Apr 14, 2021  ?1418 Patient reevaluated and resting comfortably on the stretcher.  Updated patient and mother regarding lab and imaging findings.  Temperature rechecked by myself, temperature improved to 99.9 following treatment regimen in the ED.  Discussed discharge treatment plan with patient and mother at bedside.  Answered all available questions.  Both agreeable at this time.  Patient appears safe for discharge.   [SB]  ?  ?Clinical Course User Index ?[SB] Haji Delaine A, PA-C  ? ?                        ?Medical Decision Making ?Amount and/or Complexity of Data Reviewed ?Labs: ordered. ?Radiology: ordered. ? ?Risk ?Prescription drug management. ? ? ?Patient with sore throat onset 6 days.  Denies sick contacts.  On exam patient with symmetric kissing tonsils with tonsillar exudate and erythema  and noted bilaterally.  Uvula midline without swelling.  No acute cardiovascular, respiratory, abdominal exam findings.  Differential diagnosis includes strep pharyngitis, peritonsillar abscess, tonsillitis, vi

## 2021-04-17 LAB — CULTURE, BLOOD (ROUTINE X 2)
Culture: NO GROWTH
Culture: NO GROWTH
Special Requests: ADEQUATE
Special Requests: ADEQUATE

## 2021-06-22 NOTE — Progress Notes (Unsigned)
FOLLOW UP Date of Service/Encounter:  06/24/21   Subjective:  Christine Diaz (DOB: 10-Jan-2001) is a 21 y.o. female who [facility] in re-evaluation of the following: asthma, allergic rhinitis and conjunctivitis, reflux History obtained from: chart review and patient and mother.  For Review, LV was on 02/05/21  with Thermon Leyland, FNP seen for acute visit for worsening sinus congestion not improving with nasal rinses twice a day and Flonase daily .  This is her third sinus infection since August 2022. She was interested in possible AIT and ENT referral.  She ws treated with Augmentin  Since last visit, she was evaluated by ENT on 02/23/21: no polyps found; DX: migraine variant vs sinus infection, no FU scheduled. FU visit following tonsillitis on 05/06/21: tonsillectomy offered  Pertinent History/Diagnostics:  - Asthma: controlled with Symbicort 160, 2 puffs BID and montelukast - Allergic Rhinitis: has hx of reflux, getting 3-4 sinus infections per year requiring antibiotics  - SPT environmental panel 02/19/2018 - positive to grass pollen, weed pollen, tree pollen, cat, dog, and dust mite  - CT soft tissue neck (04/14/21) -Enlarged/edematous palatine tonsils with striated enhancement, compatible with tonsillitis. Adenoid hypertrophy. Normal appearance of the epiglottis and larynx. Large upper cervical chain nodes bilaterally, nonspecific but probably reactive given above findings.  - CT sinus: 04/12/21: 1. No sinus inflammation. 2. Normal maxillofacial CT Reflux:   - Upper GI: 03/06/20: 1. A single bout of reflux into the mid esophagus was identified. 2. The esophagus, stomach, and duodenum are otherwise normal. 3. Visualization of the terminal ileum is somewhat suboptimal due to  its location deep in the right side of the pelvis. However, it was  visualized and grossly normal in appearance. The remainder of the  small bowel is unremarkable.   -------------------------------------------------------------------------------------- Today presents for follow-up. Asthma: exercise-induced mostly, she is doing increased cardio over the summer, and notices she needs to stop to catch her breath.  She is using albuterol prior to exercise which helps relieve this. No asthma "attacks" since last visit. She only uses Symbicort when her cough flares up.  She has not used consistently, and has not started during current coughing episode.  Allergic rhinitis and conjunctivitis: Allergies have been out of control. She stays congested, blowing her nose. She is constantly coughing stuff up and she feels it is coming from her chest. She is taking claritin and feels it is time to switch medications. She is not using her flonase consistently.  She is interested in allergy shots.  They are planning on getting a second opinion regarding tonsillitis. She has had lots of sore throats, sinusitis, enlarged tonsils.   She has been on multiple courses of antibiotics this year for upper airway infections (sinus, throat, etc)  Reflux: She has multiple episodes per week despite taking pepcid.  She has cut out gluten, corn and dairy.  This has not made a difference. She experiences bloating and reflux no matter what she takes.  They were evaluated by GI and had a upper GI and was told things were good and she was not given any FU.  On review of her upper GI, reflux was visualized.  On review of chart, evaluated by Dr. Lanae Boast and discussed omeprazole 20 mg daily, miralax, possible linzess.   They started seeing integrative medicine (Dr. Lavonne Chick).  They are focusing on gut health.  She was told "she is a bucket of histamine."  She took 14 days of neomycin to  kill off bad bacteria of her gut.  None of these measures have been helpful.      Allergies as of 06/24/2021   No Known Allergies      Medication List        Accurate as of Jun 24, 2021  1:29 PM. If you have  any questions, ask your nurse or doctor.          budesonide-formoterol 160-4.5 MCG/ACT inhaler Commonly known as: Symbicort Inhale 2 puffs into the lungs 2 (two) times daily.   doxycycline 100 MG capsule Commonly known as: VIBRAMYCIN Take 1 capsule (100 mg total) by mouth 2 (two) times daily.   fluticasone 50 MCG/ACT nasal spray Commonly known as: FLONASE 1 spray per nostril twice daily if needed for stuffy nose   loratadine 10 MG tablet Commonly known as: CLARITIN Take 10 mg by mouth daily as needed for allergies.   montelukast 10 MG tablet Commonly known as: SINGULAIR TAKE ONE TABLET BY MOUTH AT BEDTIME   neomycin 500 MG tablet Commonly known as: MYCIFRADIN Take 500 mg by mouth 2 (two) times daily.   norethindrone-ethinyl estradiol-FE 1-20 MG-MCG tablet Commonly known as: LOESTRIN FE Take 1 tablet by mouth daily.   olopatadine 0.1 % ophthalmic solution Commonly known as: Patanol Place 1 drop into both eyes 2 (two) times daily as needed for allergies.   omeprazole 20 MG capsule Commonly known as: PRILOSEC Take 1 capsule (20 mg total) by mouth daily.   predniSONE 20 MG tablet Commonly known as: DELTASONE 3 tabs po day one, then 2 tabs daily x 4 days   Ventolin HFA 108 (90 Base) MCG/ACT inhaler Generic drug: albuterol Inhale 2 puffs into the lungs every 4 (four) hours as needed.       Past Medical History:  Diagnosis Date   Asthma    Past Surgical History:  Procedure Laterality Date   implant     Tooth   no surgical history     WISDOM TOOTH EXTRACTION  09/18/2018   and implant   Otherwise, there have been no changes to her past medical history, surgical history, family history, or social history.  ROS: All others negative except as noted per HPI.   Objective:  BP 114/70   Pulse 77   Temp 97.7 F (36.5 C) (Temporal)   Resp 18   Ht 5' 8.5" (1.74 m)   Wt 160 lb 4.8 oz (72.7 kg)   SpO2 98%   BMI 24.02 kg/m  Body mass index is 24.02  kg/m. Physical Exam: General Appearance:  Alert, cooperative, no distress, appears stated age  Head:  Normocephalic, without obvious abnormality, atraumatic  Eyes:  Conjunctiva clear, EOM's intact  Nose: Nares normal, hypertrophic turbinates, normal mucosa, no visible anterior polyps, and septum midline  Throat: Lips, tongue normal; teeth and gums normal, tonsils 3+, no tonsillar exudate, and + cobblestoning  Neck: Supple, symmetrical  Lungs:   clear to auscultation bilaterally, Respirations unlabored, intermittent dry coughing  Heart:  regular rate and rhythm and no murmur, Appears well perfused  Extremities: No edema  Skin: Skin color, texture, turgor normal, no rashes or lesions on visualized portions of skin  Neurologic: No gross deficits  Spirometry:  Tracings reviewed. Her effort: Good reproducible efforts. FVC: 4.19L FEV1: 3.60L, 95% predicted FEV1/FVC ratio: 98% Interpretation:  nonobstructive ratio, scooping on expiratory loop .  Please see scanned spirometry results for details.  Assessment/Plan   Suspect combination of both upper and lower airway inflammation including drainage coming  from her allergies, reflux which is uncontrolled, and underlying asthma.  I suspect that her current coughing episode is being driven mostly by upper airway inflammation based on today's history.  Her spirometry looks great.   Discussed increasing management for postnasal drainage as well as reflux.  I also strongly recommended allergy injections.  They will return next week for allergy testing.  We discussed RUSH vs traditional build-up and she is interested in RUSH to be completed over the summer before return to [employer] for Fall semester.  Asthma Continue Symbicort 160-2 puffs twice a day as needed - can use prior to exercise in place of albuterol  Continue montelukast 10 mg once a day to help prevent cough and wheeze. Continue albuterol 2 puffs every 4 hours as needed for cough or  wheeze  Allergic rhinitis Continue allergen avoidance measures directed toward pollens, cat, dog, and dust mites Start Zyrtec 10 mg once a day for runny nose or itchy eyes. You can rotate to a different antihistamine about every 3 months if you find this helpful. Some examples of over the counter antihistamines include Zyrtec (cetirizine), Xyzal (levocetirizine), Allegra (fexofenadine), and Claritin (loratidine).  Continue fluticasone nasal spray doing 2 sprays each nostril once a day as needed for stuffy nose.  In the right nostril, point the applicator out toward the right ear. In the left nostril, point the applicator out toward the left ear Continue saline nasal rinses as needed for nasal symptoms. Use this before any medicated nasal sprays for best result Start Atrovent nasal spray 1-2 sprays up to three times daily as needed; use less frequently if nasal mucosa becomes too dry Allergy injections-let's get you back in for allergy testing and then we can start allergy injections; you can do rapid desensitization (one day in [facility]) to get to maintenance faster  Allergic conjunctivitis Some over the counter eye drops include Pataday one drop in each eye once a day as needed for red, itchy eyes OR Zaditor one drop in each eye twice a day as needed for red itchy eyes.  Reflux Continue dietary lifestyle modifications as listed below Restart omeprazole 40 mg once a day to prevent reflux or heartburn-take 20 minutes prior to any morning foods Continue pepcid 20 mg nightly  Call the clinic if this treatment plan is not working well for you  Follow up next week or sooner if needed.   Tonny Bollman, MD  [facility]

## 2021-06-23 ENCOUNTER — Ambulatory Visit: Payer: 59 | Admitting: Family Medicine

## 2021-06-24 ENCOUNTER — Ambulatory Visit: Payer: 59 | Admitting: Internal Medicine

## 2021-06-24 ENCOUNTER — Encounter: Payer: Self-pay | Admitting: Internal Medicine

## 2021-06-24 VITALS — BP 114/70 | HR 77 | Temp 97.7°F | Resp 18 | Ht 68.5 in | Wt 160.3 lb

## 2021-06-24 DIAGNOSIS — K219 Gastro-esophageal reflux disease without esophagitis: Secondary | ICD-10-CM | POA: Diagnosis not present

## 2021-06-24 DIAGNOSIS — R053 Chronic cough: Secondary | ICD-10-CM

## 2021-06-24 DIAGNOSIS — H1013 Acute atopic conjunctivitis, bilateral: Secondary | ICD-10-CM | POA: Diagnosis not present

## 2021-06-24 DIAGNOSIS — J302 Other seasonal allergic rhinitis: Secondary | ICD-10-CM

## 2021-06-24 DIAGNOSIS — J3089 Other allergic rhinitis: Secondary | ICD-10-CM

## 2021-06-24 DIAGNOSIS — J4540 Moderate persistent asthma, uncomplicated: Secondary | ICD-10-CM

## 2021-06-24 DIAGNOSIS — H1010 Acute atopic conjunctivitis, unspecified eye: Secondary | ICD-10-CM

## 2021-06-24 MED ORDER — FLUTICASONE PROPIONATE 50 MCG/ACT NA SUSP: NASAL | 5 refills | Status: DC

## 2021-06-24 MED ORDER — OMEPRAZOLE 40 MG PO CPDR: 40.0000 mg | DELAYED_RELEASE_CAPSULE | Freq: Every day | ORAL | 3 refills | Status: DC

## 2021-06-24 MED ORDER — VENTOLIN HFA 108 (90 BASE) MCG/ACT IN AERS
2.0000 | INHALATION_SPRAY | RESPIRATORY_TRACT | 1 refills | Status: DC | PRN
Start: 2021-06-24 — End: 2022-06-23

## 2021-06-24 MED ORDER — MONTELUKAST SODIUM 10 MG PO TABS: 10.0000 mg | ORAL_TABLET | Freq: Every day | ORAL | 5 refills | Status: DC

## 2021-06-24 MED ORDER — IPRATROPIUM BROMIDE 0.03 % NA SOLN: 2.0000 | Freq: Three times a day (TID) | NASAL | 12 refills | Status: DC | PRN

## 2021-06-24 MED ORDER — BUDESONIDE-FORMOTEROL FUMARATE 160-4.5 MCG/ACT IN AERO: 2.0000 | INHALATION_SPRAY | Freq: Two times a day (BID) | RESPIRATORY_TRACT | 5 refills | Status: DC

## 2021-06-24 NOTE — Patient Instructions (Addendum)
Asthma Continue Symbicort 160-2 puffs twice a day as needed - can use prior to exercise in place of albuterol  Continue montelukast 10 mg once a day to help prevent cough and wheeze. Continue albuterol 2 puffs every 4 hours as needed for cough or wheeze  Allergic rhinitis Continue allergen avoidance measures directed toward pollens, cat, dog, and dust mites Start Zyrtec 10 mg once a day for runny nose or itchy eyes. You can rotate to a different antihistamine about every 3 months if you find this helpful. Some examples of over the counter antihistamines include Zyrtec (cetirizine), Xyzal (levocetirizine), Allegra (fexofenadine), and Claritin (loratidine).  Continue fluticasone nasal spray doing 2 sprays each nostril once a day as needed for stuffy nose.  In the right nostril, point the applicator out toward the right ear. In the left nostril, point the applicator out toward the left ear Continue saline nasal rinses as needed for nasal symptoms. Use this before any medicated nasal sprays for best result Start Atrovent nasal spray 1-2 sprays up to three times daily as needed; use less frequently if nasal mucosa becomes too dry Allergy injections-let's get you back in for allergy testing and then we can start allergy injections; you can do rapid desensitization (one day in [facility]) to get to maintenance faster  Allergic conjunctivitis Some over the counter eye drops include Pataday one drop in each eye once a day as needed for red, itchy eyes OR Zaditor one drop in each eye twice a day as needed for red itchy eyes.  Reflux Continue dietary lifestyle modifications as listed below Restart omeprazole 40 mg once a day to prevent reflux or heartburn-take 20 minutes prior to any morning foods Continue pepcid 20 mg nightly  Call the clinic if this treatment plan is not working well for you  Follow up next week or sooner if needed.   Lifestyle Changes for Controlling GERD When you have GERD,  stomach acid feels as if it's backing up toward your mouth. Whether or not you take medication to control your GERD, your symptoms can often be improved with lifestyle changes.   Raise Your Head Reflux is more likely to strike when you're lying down flat, because stomach fluid can flow backward more easily. Raising the head of your bed 4-6 inches can help. To do this: Slide blocks or books under the legs at the head of your bed. Or, place a wedge under the mattress. Many foam stores can make a suitable wedge for you. The wedge should run from your waist to the top of your head. Don't just prop your head on several pillows. This increases pressure on your stomach. It can make GERD worse.  Watch Your Eating Habits Certain foods may increase the acid in your stomach or relax the lower esophageal sphincter, making GERD more likely. It's best to avoid the following: Coffee, tea, and carbonated drinks (with and without caffeine) Fatty, fried, or spicy food Mint, chocolate, onions, and tomatoes Any other foods that seem to irritate your stomach or cause you pain  Relieve the Pressure Eat smaller meals, even if you have to eat more often. Don't lie down right after you eat. Wait a few hours for your stomach to empty. Avoid tight belts and tight-fitting clothes. Lose excess weight.  Tobacco and Alcohol Avoid smoking tobacco and drinking alcohol. They can make GERD symptoms worse.  Reducing Pollen Exposure The American Academy of Allergy, Asthma and Immunology suggests the following steps to reduce your exposure to  pollen during allergy seasons. Do not hang sheets or clothing out to dry; pollen may collect on these items. Do not mow lawns or spend time around freshly cut grass; mowing stirs up pollen. Keep windows closed at night.  Keep car windows closed while driving. Minimize morning activities outdoors, a time when pollen counts are usually at their highest. Stay indoors as much as  possible when pollen counts or humidity is high and on windy days when pollen tends to remain in the air longer. Use air conditioning when possible.  Many air conditioners have filters that trap the pollen spores. Use a HEPA room air filter to remove pollen form the indoor air you breathe.  Control of Dog or Cat Allergen Avoidance is the best way to manage a dog or cat allergy. If you have a dog or cat and are allergic to dog or cats, consider removing the dog or cat from the home. If you have a dog or cat but don't want to find it a new home, or if your family wants a pet even though someone in the household is allergic, here are some strategies that may help keep symptoms at bay:  Keep the pet out of your bedroom and restrict it to only a few rooms. Be advised that keeping the dog or cat in only one room will not limit the allergens to that room. Don't pet, hug or kiss the dog or cat; if you do, wash your hands with soap and water. High-efficiency particulate air (HEPA) cleaners run continuously in a bedroom or living room can reduce allergen levels over time. Regular use of a high-efficiency vacuum cleaner or a central vacuum can reduce allergen levels. Giving your dog or cat a bath at least once a week can reduce airborne allergen.   Control of Dust Mite Allergen Dust mites play a major role in allergic asthma and rhinitis. They occur in environments with high humidity wherever human skin is found. Dust mites absorb humidity from the atmosphere (ie, they do not drink) and feed on organic matter (including shed human and animal skin). Dust mites are a microscopic type of insect that you cannot see with the naked eye. High levels of dust mites have been detected from mattresses, pillows, carpets, upholstered furniture, bed covers, clothes, soft toys and any woven material. The principal allergen of the dust mite is found in its feces. A gram of dust may contain 1,000 mites and 250,000 fecal  particles. Mite antigen is easily measured in the air during house cleaning activities. Dust mites do not bite and do not cause harm to humans, other than by triggering allergies/asthma.  Ways to decrease your exposure to dust mites in your home:  1. Encase mattresses, box springs and pillows with a mite-impermeable barrier or cover  2. Wash sheets, blankets and drapes weekly in hot water (130 F) with detergent and dry them in a dryer on the hot setting.  3. Have the room cleaned frequently with a vacuum cleaner and a damp dust-mop. For carpeting or rugs, vacuuming with a vacuum cleaner equipped with a high-efficiency particulate air (HEPA) filter. The dust mite allergic individual should not be in a room which is being cleaned and should wait 1 hour after cleaning before going into the room.  4. Do not sleep on upholstered furniture (eg, couches).  5. If possible removing carpeting, upholstered furniture and drapery from the home is ideal. Horizontal blinds should be eliminated in the rooms where the person spends  the most time (bedroom, study, television room). Washable vinyl, roller-type shades are optimal.  6. Remove all non-washable stuffed toys from the bedroom. Wash stuffed toys weekly like sheets and blankets above.  7. Reduce indoor humidity to less than 50%. Inexpensive humidity monitors can be purchased at most hardware stores. Do not use a humidifier as can make the problem worse and are not recommended.

## 2021-06-29 ENCOUNTER — Ambulatory Visit: Payer: 59 | Admitting: Internal Medicine

## 2021-07-02 NOTE — Progress Notes (Signed)
FOLLOW UP Date of Service/Encounter:  07/05/21   Subjective:  Christine Diaz (DOB: [birth year]) is a 21 y.o. female [facility] in re-evaluation of the following:  asthma, allergic rhinitis and conjunctivitis, reflux History obtained from: chart review and patient.  For Review, LV was on 06/24/21  with Dr.Deovion Batrez seen for regular follow-up.  Her allergic rhinitis were not controlled.  She had multiple courses of antibiotics over the past year.  Continuous congestion and rhnorrhea and coughing with phlegm.  Having reflux mutliple times per week despite pepcid and cutting out gluten, corn and dairy from her diet. She is interested in allergy injections.  We did start ometprzole and atrovent nasal spray.  Pertinent History/Diagnostics:  - Asthma: controlled with Symbicort 160, 2 puffs BID and montelukast  - CXR 04/12/21: no acute findings. - Allergic Rhinitis: has hx of reflux, getting 3-4 sinus infections per year requiring antibiotics                - SPT environmental panel 02/19/2018 - positive to grass pollen, weed pollen, tree pollen, cat, dog, and dust mite                - CT soft tissue neck (04/14/21) -Enlarged/edematous palatine tonsils with striated enhancement, compatible with tonsillitis. Adenoid hypertrophy. Normal appearance of the epiglottis and larynx. Large upper cervical chain nodes bilaterally, nonspecific but probably reactive given above findings.                - CT sinus: 04/12/21: 1. No sinus inflammation. 2. Normal maxillofacial CT Reflux:                 - Upper GI: 03/06/20: 1. A single bout of reflux into the mid esophagus was identified. 2. The esophagus, stomach, and duodenum are otherwise normal. 3. Visualization of the terminal ileum is somewhat suboptimal due to  its location deep in the right side of the pelvis. However, it was  visualized and grossly normal in appearance. The remainder of the  small bowel is  unremarkable.  -ENT on 02/23/21: no polyps found; DX: migraine variant vs sinus infection, no FU scheduled. FU visit following tonsillitis on 05/06/21: tonsillectomy offered  Today presents for follow-up.  She is here to update her allergy testing with the goal of starting allergy injections.  She is a rising sophomore at [employer] pursuing biology degree with plans to enter dentistry program.  She would like to continue injections at school. She continues to have a chronic congested sounding cough.Using Symbicort 160 mg BID and atrovent.  Cough less frequent, but still occurring. She has been on antibiotics-neomycin which she was given through [facility].  Has had augmentin in January and amoxicillin prior to that and doxycycline  in March which do not help.  Has not been treated with azithromycin.  She does continue to have significant reflux and abdominal pains which have slightly improved since starting omeprazole 40 mg in the morning.  Allergies as of 07/05/2021   No Known Allergies      Medication List        Accurate as of July 05, 2021  1:24 PM. If you have any questions, ask your nurse or doctor.          STOP taking these medications    doxycycline 100 MG capsule Commonly known as: VIBRAMYCIN Stopped by: Tonny Bollman, MD   neomycin 500 MG tablet Commonly known as: MYCIFRADIN Stopped by: Tonny Bollman,  MD       TAKE these medications    azithromycin 250 MG tablet Commonly known as: Zithromax Z-Pak Take 2 tablets by mouth on day 1, followed by 1 tablet by mouth daily on days 2-5. Started by: Tonny Bollman, MD   budesonide-formoterol 160-4.5 MCG/ACT inhaler Commonly known as: Symbicort Inhale 2 puffs into the lungs 2 (two) times daily.   fluticasone 50 MCG/ACT nasal spray Commonly known as: FLONASE 1 spray per nostril twice daily if needed for stuffy nose   ipratropium 0.03 % nasal spray Commonly known as: ATROVENT Place 2 sprays into both nostrils 3 (three)  times daily as needed for rhinitis.   loratadine 10 MG tablet Commonly known as: CLARITIN Take 10 mg by mouth daily as needed for allergies.   montelukast 10 MG tablet Commonly known as: SINGULAIR Take 1 tablet (10 mg total) by mouth at bedtime.   norethindrone-ethinyl estradiol-FE 1-20 MG-MCG tablet Commonly known as: LOESTRIN FE Take 1 tablet by mouth daily.   olopatadine 0.1 % ophthalmic solution Commonly known as: Patanol Place 1 drop into both eyes 2 (two) times daily as needed for allergies.   omeprazole 40 MG capsule Commonly known as: PRILOSEC Take 1 capsule (40 mg total) by mouth daily. Take 20 minutes prior to meals.   predniSONE 20 MG tablet Commonly known as: DELTASONE 3 tabs po day one, then 2 tabs daily x 4 days   Ventolin HFA 108 (90 Base) MCG/ACT inhaler Generic drug: albuterol Inhale 2 puffs into the lungs every 4 (four) hours as needed.       Past Medical History:  Diagnosis Date   Asthma    Past Surgical History:  Procedure Laterality Date   implant     Tooth   no surgical history     WISDOM TOOTH EXTRACTION  09/18/2018   and implant   Otherwise, there have been no changes to her past medical history, surgical history, family history, or social history.  ROS: All others negative except as noted per HPI.   Objective:  BP 110/76   Pulse 94   Temp 98.1 F (36.7 C)   Resp 17   SpO2 98%  There is no height or weight on file to calculate BMI. Physical Exam: General Appearance:  Alert, cooperative, no distress, appears stated age  Head:  Normocephalic, without obvious abnormality, atraumatic  Eyes:  Conjunctiva clear, EOM's intact  Nose: Nares normal,  no rhinorrhea  Throat: Lips, tongue normal; teeth and gums normal  Neck: Supple, symmetrical  Lungs:   clear to auscultation bilaterally, Respirations unlabored,  intermittent congested sound cough  Heart:  regular rate and rhythm and no murmur, Appears well perfused  Extremities: No edema   Skin: Skin color, texture, turgor normal, no rashes or lesions on visualized portions of skin  Neurologic: No gross deficits   Skin Testing: Environmental allergy panel and select foods. Adequate positive and negative controls. Results discussed with patient/family.  Airborne Adult Perc - 07/05/21 1039     Time Antigen Placed 1039    Allergen Manufacturer Waynette Buttery    Location Back    Number of Test 59    Panel 1 Select    1. Control-Buffer 50% Glycerol Negative    2. Control-Histamine 1 mg/ml 3+    3. Albumin saline Negative    4. Bahia 4+    5. [place] 4+    6. Johnson 4+    7. [place] Blue 4+    8. Meadow Fescue 4+  9. Perennial Rye 4+    10. Sweet Vernal Negative    11. Timothy 4+    12. Cocklebur Negative    13. Burweed Marshelder Negative    14. Ragweed, short Negative    15. Ragweed, Giant Negative    16. Plantain,  English 3+    17. Lamb's Quarters Negative    18. Sheep Sorrell Negative    19. Rough Pigweed Negative    20. Marsh Elder, Rough Negative    21. Mugwort, Common Negative    22. Ash mix Negative    23. Birch mix Negative    24. Beech American Negative    25. Box, Elder 3+    26. Cedar, red Negative    27. Cottonwood, Eastern 2+    28. Elm mix Negative    29. Hickory 3+    30. Maple mix Negative    31. Oak, Guinea[place]issau mix 3+    32. Pecan Pollen 3+    33. Pine mix Negative    34. Sycamore Eastern Negative    35. Walnut, Black Pollen 3+    36. Alternaria alternata Negative    37. Cladosporium Herbarum 3+    38. Aspergillus mix Negative    39. Penicillium mix Negative    40. Bipolaris sorokiniana (Helminthosporium) Negative    41. Drechslera spicifera (Curvularia) Negative    42. Mucor plumbeus Negative    43. Fusarium moniliforme Negative    44. Aureobasidium pullulans (pullulara) Negative    45. Rhizopus oryzae Negative    46. Botrytis cinera Negative    47. Epicoccum nigrum Negative    48. Phoma betae Negative    49. Candida Albicans  Negative    50. Trichophyton mentagrophytes Negative    51. Mite, D Farinae  5,000 AU/ml 2+    52. Mite, D Pteronyssinus  5,000 AU/ml 3+    53. Cat Hair 10,000 BAU/ml 3+    54.  Dog Epithelia 3+    55. Mixed Feathers Negative    56. Horse Epithelia Negative    57. Cockroach, German Negative    58. Mouse Negative    59. Tobacco Leaf Negative             Food Perc - 07/05/21 1039       Test Information   Time Antigen Placed 1039    Allergen Manufacturer Waynette Buttery    Location Back    Number of allergen test 10    Food Select      Food   1. Peanut Negative    2. Soybean food Negative    3. Wheat, whole Negative    4. Sesame Negative    5. Milk, cow Negative    6. Egg White, chicken Negative    7. Casein Negative    8. Shellfish mix Negative    9. Fish mix Negative    10. Cashew Negative             Intradermal - 07/05/21 1039     Time Antigen Placed 1039    Allergen Manufacturer Waynette Buttery    Location Arm    Number of Test 4    Control Negative    Ragweed mix 4+    Mold 1 3+    Cockroach Negative             Allergy testing results were read and interpreted by myself, documented by clinical staff.  Assessment/Plan  Odelia is a 21 year old female who is being followed for moderate persistent  asthma, allergic rhinitis, and reflux who had updated allergy testing which continues to reflect seasonal and perennial allergic rhinitis.  She is not controlled with her current medications and will be a great candidate for allergy injections.  We discussed rapid versus traditional build up as well as risk and benefits of both.  She would like to proceed with rapid buildup. Additionally, she continues to have reflux and abdominal pain as well as chronic congestive cough with normal lung exam and previous normal x-ray during current symptoms.  I am going to treat for atypical infections as it does not appear that this has been attempted.  I have also increased her medications  for reflux.  Query aspiration as a potential factor.  [facility] for further evaluation of her abdominal issues.  Asthma Continue Symbicort 160-2 puffs twice a day as needed - can use prior to exercise in place of albuterol  Continue montelukast 10 mg once a day to help prevent cough and wheeze. Continue albuterol 2 puffs every 4 hours as needed for cough or wheeze  Allergic rhinitis Allergy testing today positive to grass pollen, weed pollen, ragweed, tree pollen, outdoor molds, dust mites, cat, dog; allergen avoidance as below Continue Zyrtec 10 mg once a day for runny nose or itchy eyes. Some examples of over the counter antihistamines include Zyrtec (cetirizine), Xyzal (levocetirizine), Allegra (fexofenadine), and Claritin (loratidine).  Continue fluticasone nasal spray doing 2 sprays each nostril once a day as needed for stuffy nose.  In the right nostril, point the applicator out toward the right ear. In the left nostril, point the applicator out toward the left ear Continue saline nasal rinses as needed for nasal symptoms. Use this before any medicated nasal sprays for best result Continue Atrovent nasal spray 1-2 sprays up to three times daily as needed; use less frequently if nasal mucosa becomes too dry Start RUSH immunotherapy on 08/06/21-call insurance to ensure benefits.  Congested cough:  - azithromycin-take 2 tablets by mouth on day 1 followed by 1 tablet on days 2-5. - may consider referral to Pulmonology if no improvement with these changes  Allergic conjunctivitis Some over the counter eye drops include Pataday one drop in each eye once a day as needed for red, itchy eyes OR Zaditor one drop in each eye twice a day as needed for red itchy eyes.  Reflux-uncontrolled Continue dietary lifestyle modifications as listed below Increase omeprazole 40 mg twice a day to prevent reflux or heartburn-take 20 minutes prior to any morning foods Continue  pepcid 20 mg nightly Allergy testing to most common food allergens negative [facility]  Call the clinic if this treatment plan is not working well for you  [facility]. [facility] appointment on 08/06/21 at 8:30 AM -try to arrive a few minutes early so we can get started by 8:30.  Make sure to take your premedications the day before and the day of.  It was great seeing you again.  Tonny BollmanErin Daffney Greenly, MD  [facility]

## 2021-07-05 ENCOUNTER — Ambulatory Visit: Payer: 59 | Admitting: Internal Medicine

## 2021-07-05 ENCOUNTER — Encounter: Payer: Self-pay | Admitting: Internal Medicine

## 2021-07-05 VITALS — BP 110/76 | HR 94 | Temp 98.1°F | Resp 17

## 2021-07-05 DIAGNOSIS — J302 Other seasonal allergic rhinitis: Secondary | ICD-10-CM

## 2021-07-05 DIAGNOSIS — R053 Chronic cough: Secondary | ICD-10-CM

## 2021-07-05 DIAGNOSIS — J3089 Other allergic rhinitis: Secondary | ICD-10-CM | POA: Diagnosis not present

## 2021-07-05 DIAGNOSIS — H1013 Acute atopic conjunctivitis, bilateral: Secondary | ICD-10-CM

## 2021-07-05 DIAGNOSIS — K219 Gastro-esophageal reflux disease without esophagitis: Secondary | ICD-10-CM | POA: Diagnosis not present

## 2021-07-05 DIAGNOSIS — H1010 Acute atopic conjunctivitis, unspecified eye: Secondary | ICD-10-CM

## 2021-07-05 DIAGNOSIS — J4540 Moderate persistent asthma, uncomplicated: Secondary | ICD-10-CM

## 2021-07-05 MED ORDER — EPINEPHRINE 0.3 MG/0.3ML IJ SOAJ: 0.3000 mg | Freq: Once | INTRAMUSCULAR | 2 refills | Status: DC | PRN

## 2021-07-05 MED ORDER — AZITHROMYCIN 250 MG PO TABS: ORAL_TABLET | ORAL | 0 refills | Status: DC

## 2021-07-05 NOTE — Patient Instructions (Signed)
Asthma Continue Symbicort 160-2 puffs twice a day as needed - can use prior to exercise in place of albuterol  Continue montelukast 10 mg once a day to help prevent cough and wheeze. Continue albuterol 2 puffs every 4 hours as needed for cough or wheeze  Allergic rhinitis Allergy testing today positive to grass pollen, weed pollen, ragweed, tree pollen, outdoor molds, dust mites, cat, dog; allergen avoidance as below Continue Zyrtec 10 mg once a day for runny nose or itchy eyes. Some examples of over the counter antihistamines include Zyrtec (cetirizine), Xyzal (levocetirizine), Allegra (fexofenadine), and Claritin (loratidine).  Continue fluticasone nasal spray doing 2 sprays each nostril once a day as needed for stuffy nose.  In the right nostril, point the applicator out toward the right ear. In the left nostril, point the applicator out toward the left ear Continue saline nasal rinses as needed for nasal symptoms. Use this before any medicated nasal sprays for best result Continue Atrovent nasal spray 1-2 sprays up to three times daily as needed; use less frequently if nasal mucosa becomes too dry Start R[facility] immunotherapy on 08/06/21-call insurance to ensure benefits.  Congested cough:  - azithromycin-take 2 tablets on day 1 followed by 1 tablet on days 2-5. - may consider referral to Pulmonology if no improvement with these changes  Allergic conjunctivitis Some over the counter eye drops include Pataday one drop in each eye once a day as needed for red, itchy eyes OR Zaditor one drop in each eye twice a day as needed for red itchy eyes.  Reflux-uncontrolled Continue dietary lifestyle modifications as listed below Increase omeprazole 40 mg twice a day to prevent reflux or heartburn-take 20 minutes prior to any morning foods Continue pepcid 20 mg nightly Referral to [facility]  Call the clinic if this treatment plan is not working well for you  Follow up for your [facility]  appointment. [facility] appointment on 08/06/21 at 8:30 AM -try to arrive a few minutes early so we can get started by 8:30.  Make sure to take your premedications the day before and the day of.  It was great seeing you again.   Lifestyle Changes for Controlling GERD When you have GERD, stomach acid feels as if it's backing up toward your mouth. Whether or not you take medication to control your GERD, your symptoms can often be improved with lifestyle changes.   Raise Your Head Reflux is more likely to strike when you're lying down flat, because stomach fluid can flow backward more easily. Raising the head of your bed 4-6 inches can help. To do this: Slide blocks or books under the legs at the head of your bed. Or, place a wedge under the mattress. Many foam stores can make a suitable wedge for you. The wedge should run from your waist to the top of your head. Don't just prop your head on several pillows. This increases pressure on your stomach. It can make GERD worse.  Watch Your Eating Habits Certain foods may increase the acid in your stomach or relax the lower esophageal sphincter, making GERD more likely. It's best to avoid the following: Coffee, tea, and carbonated drinks (with and without caffeine) Fatty, fried, or spicy food Mint, chocolate, onions, and tomatoes Any other foods that seem to irritate your stomach or cause you pain  Relieve the Pressure Eat smaller meals, even if you have to eat more often. Don't lie down right after you eat. Wait a few hours for your stomach to empty.  Avoid tight belts and tight-fitting clothes. Lose excess weight.  Tobacco and Alcohol Avoid smoking tobacco and drinking alcohol. They can make GERD symptoms worse.  Reducing Pollen Exposure The American Academy of Allergy, Asthma and Immunology suggests the following steps to reduce your exposure to pollen during allergy seasons. Do not hang sheets or clothing out to dry; pollen may collect on  these items. Do not mow lawns or spend time around freshly cut grass; mowing stirs up pollen. Keep windows closed at night.  Keep car windows closed while driving. Minimize morning activities outdoors, a time when pollen counts are usually at their highest. Stay indoors as much as possible when pollen counts or humidity is high and on windy days when pollen tends to remain in the air longer. Use air conditioning when possible.  Many air conditioners have filters that trap the pollen spores. Use a HEPA room air filter to remove pollen form the indoor air you breathe.  Control of Dog or Cat Allergen Avoidance is the best way to manage a dog or cat allergy. If you have a dog or cat and are allergic to dog or cats, consider removing the dog or cat from the home. If you have a dog or cat but don't want to find it a new home, or if your family wants a pet even though someone in the household is allergic, here are some strategies that may help keep symptoms at bay:  Keep the pet out of your bedroom and restrict it to only a few rooms. Be advised that keeping the dog or cat in only one room will not limit the allergens to that room. Don't pet, hug or kiss the dog or cat; if you do, wash your hands with soap and water. High-efficiency particulate air (HEPA) cleaners run continuously in a bedroom or living room can reduce allergen levels over time. Regular use of a high-efficiency vacuum cleaner or a central vacuum can reduce allergen levels. Giving your dog or cat a bath at least once a week can reduce airborne allergen.   Control of Dust Mite Allergen Dust mites play a major role in allergic asthma and rhinitis. They occur in environments with high humidity wherever human skin is found. Dust mites absorb humidity from the atmosphere (ie, they do not drink) and feed on organic matter (including shed human and animal skin). Dust mites are a microscopic type of insect that you cannot see with the naked eye.  High levels of dust mites have been detected from mattresses, pillows, carpets, upholstered furniture, bed covers, clothes, soft toys and any woven material. The principal allergen of the dust mite is found in its feces. A gram of dust may contain 1,000 mites and 250,000 fecal particles. Mite antigen is easily measured in the air during house cleaning activities. Dust mites do not bite and do not cause harm to humans, other than by triggering allergies/asthma.  Ways to decrease your exposure to dust mites in your home:  1. Encase mattresses, box springs and pillows with a mite-impermeable barrier or cover  2. Wash sheets, blankets and drapes weekly in hot water (130 F) with detergent and dry them in a dryer on the hot setting.  3. Have the room cleaned frequently with a vacuum cleaner and a damp dust-mop. For carpeting or rugs, vacuuming with a vacuum cleaner equipped with a high-efficiency particulate air (HEPA) filter. The dust mite allergic individual should not be in a room which is being cleaned and should wait  1 hour after cleaning before going into the room.  4. Do not sleep on upholstered furniture (eg, couches).  5. If possible removing carpeting, upholstered furniture and drapery from the home is ideal. Horizontal blinds should be eliminated in the rooms where the person spends the most time (bedroom, study, television room). Washable vinyl, roller-type shades are optimal.  6. Remove all non-washable stuffed toys from the bedroom. Wash stuffed toys weekly like sheets and blankets above.  7. Reduce indoor humidity to less than 50%. Inexpensive humidity monitors can be purchased at most hardware stores. Do not use a humidifier as can make the problem worse and are not recommended.  Control of Mold Allergen   Mold and fungi can grow on a variety of surfaces provided certain temperature and moisture conditions exist.  Outdoor molds grow on plants, decaying vegetation and soil.  The major  outdoor mold, Alternaria and Cladosporium, are found in very high numbers during hot and dry conditions.  Generally, a late Summer - Fall peak is seen for common outdoor fungal spores.  Rain will temporarily lower outdoor mold spore count, but counts rise rapidly when the rainy period ends.  The most important indoor molds are Aspergillus and Penicillium.  Dark, humid and poorly ventilated basements are ideal sites for mold growth.  The next most common sites of mold growth are the bathroom and the kitchen.  Outdoor (Seasonal) Mold Control  Use air conditioning and keep windows closed Avoid exposure to decaying vegetation. Avoid leaf raking. Avoid grain handling. Consider wearing a face mask if working in moldy areas.

## 2021-07-05 NOTE — Addendum Note (Signed)
Addended by: Clemon Chambers on: 07/05/2021 05:55 PM   Modules accepted: Orders

## 2021-07-06 NOTE — Progress Notes (Signed)
Aeroallergen Immunotherapy   Ordering Provider: Dr. Tonny Bollman   Patient Details  Name: Christine Diaz  MRN: [id number]  Date of Birth: 29-Jun-2000   Order 1 of 2   Vial Label: G-W-T-Dog   0.3 ml (Volume)  BAU Concentration -- 7 Grass Mix* 100,000 ([address] [place], [place], [place], [place] Rye, RedTop, Sweet Vernal, Timothy)  0.2 ml (Volume)  1:20 Concentration -- Bahia  0.3 ml (Volume)  BAU Concentration -- French Southern Territories 10,000  0.2 ml (Volume)  1:20 Concentration -- Johnson  0.3 ml (Volume)  1:20 Concentration -- Ragweed Mix  0.2 ml (Volume)  1:10 Concentration -- Plantain English  0.5 ml (Volume)  1:20 Concentration -- Eastern 10 Tree Mix (also Sweet Gum)  0.2 ml (Volume)  1:20 Concentration -- Box Elder  0.2 ml (Volume)  1:10 Concentration -- Pecan Pollen  0.2 ml (Volume)  1:20 Concentration -- Walnut, Black Pollen  0.5 ml (Volume)  1:10 Concentration -- Dog Epithelia    3.1  ml Extract Subtotal  1.9  ml Diluent  5.0  ml Maintenance Total   Schedule:  RUSH followed by B  Silver Vial (1:1,000,000): RUSH  Blue Vial (1:100,000): RUSH  Yellow Vial (1:10,000): RUSH  Green Vial (1:1,000): Schedule B (6 doses)  Red Vial (1:100): Schedule A (10 doses)   Special Instructions: RUSH followed by B, [place] home base, would like to transfer to [employer] during school year

## 2021-07-06 NOTE — Progress Notes (Signed)
Aeroallergen Immunotherapy   Ordering Provider: Dr. Tonny Bollman   Patient Details  Name: Christine Diaz  MRN: [id number]  Date of Birth: [birth year]   Order 2 of 2   Vial Label: M-DM-Cat   0.2 ml (Volume)  1:20 Concentration -- Alternaria alternata  0.2 ml (Volume)  1:20 Concentration -- Cladosporium herbarum  0.5 ml (Volume)  1:10 Concentration -- Cat Hair  0.5 ml (Volume)   AU Concentration -- Mite Mix (DF 5,000 & DP 5,000)    1.4  ml Extract Subtotal  3.6  ml Diluent  5.0  ml Maintenance Total   Schedule:  RUSH then B  Silver Vial (1:1,000,000): RUSH  Blue Vial (1:100,000): RUSH  Yellow Vial (1:10,000): RUSH  Green Vial (1:1,000): Schedule B (6 doses)  Red Vial (1:100): Schedule A (10 doses)   Special Instructions: RUSH then B schedule, [employer] home office, 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

## 2021-07-06 NOTE — Progress Notes (Signed)
VIALS TO BE MADE CLOSER TO APPT. ?

## 2021-07-13 ENCOUNTER — Telehealth: Payer: Self-pay | Admitting: Internal Medicine

## 2021-07-13 NOTE — Telephone Encounter (Signed)
Referral placed to [facility]  [address] [place] [address] [phone] Fax:[phone]  Sent patient Christine Diaz message.

## 2021-07-13 NOTE — Telephone Encounter (Signed)
Thank you :)

## 2021-07-13 NOTE — Telephone Encounter (Signed)
-----   Message from Tonny Bollman, MD sent at 07/05/2021  1:31 PM EDT ----- Can we get her referred to [facility]? She goes to school in [place]. She has seen [facility] and would like a second opinion for reflux.

## 2021-07-14 ENCOUNTER — Telehealth: Payer: Self-pay | Admitting: Internal Medicine

## 2021-07-14 NOTE — Telephone Encounter (Signed)
Someone will need to call insurance and find out what they are asking for.  If we need a PA, please process.  Thanks.

## 2021-07-14 NOTE — Telephone Encounter (Signed)
Please advise to pa for rush 

## 2021-07-14 NOTE — Telephone Encounter (Signed)
Pt's mom states a PA is needed for RUSH. She has a letter that states the provider is responsible for  getting the PA.Marland Kitchen

## 2021-07-15 ENCOUNTER — Telehealth: Payer: Self-pay | Admitting: Internal Medicine

## 2021-07-15 NOTE — Telephone Encounter (Signed)
Pt will be at camp for 3 weeks and informed mom it would be best to start rush and shots after camp so she is not missing any doses and having to be backed down mom agreed so pt is changed to aug 18th

## 2021-07-15 NOTE — Telephone Encounter (Signed)
Please advise to taking vials out the next week

## 2021-07-15 NOTE — Telephone Encounter (Signed)
Spoke with [facility] pa not needed summer from reference #[id number] mom informed

## 2021-07-15 NOTE — Telephone Encounter (Signed)
Pt changed rush date to 7/21, mom would like her to be sooner if possible. Mom asked if after she does RUSH, can she take her vials to camp the next week, there will be a nurse onsite.

## 2021-07-19 ENCOUNTER — Encounter: Payer: Self-pay | Admitting: Internal Medicine

## 2021-07-20 NOTE — Progress Notes (Signed)
VIALS EXP 07-21-22 

## 2021-07-21 DIAGNOSIS — J3081 Allergic rhinitis due to animal (cat) (dog) hair and dander: Secondary | ICD-10-CM | POA: Diagnosis not present

## 2021-07-22 DIAGNOSIS — J3089 Other allergic rhinitis: Secondary | ICD-10-CM | POA: Diagnosis not present

## 2021-08-06 ENCOUNTER — Ambulatory Visit: Payer: 59 | Admitting: Internal Medicine

## 2021-08-12 NOTE — Progress Notes (Unsigned)
RAPID DESENSITIZATION Note  RE: Christine Diaz MRN: [id number] DOB: Dec 15, 2000 Date of Office Visit: 08/13/2021  Subjective:  Patient presents today for rapid desensitization.  Interval History: Patient has not been ill, she has taken all premedications as per protocol.  Recent/Current History: Pulmonary disease: no Cardiac disease: no Respiratory infection: no Rash: no Itch: no Swelling: no Cough:  yes-intermittently much more infrequent Shortness of breath: no Runny/stuffy nose: no Itchy eyes: no Beta-blocker use:  N/A  Patient/guardian was informed of the procedure with verbalized understanding of the risk of anaphylaxis. Consent has been signed.   Medication List:  Current Outpatient Medications  Medication Sig Dispense Refill   azithromycin (ZITHROMAX Z-PAK) 250 MG tablet Take 2 tablets by mouth on day 1, followed by 1 tablet by mouth daily on days 2-5. 6 each 0   budesonide-formoterol (SYMBICORT) 160-4.5 MCG/ACT inhaler Inhale 2 puffs into the lungs 2 (two) times daily. 1 each 5   EPINEPHrine (EPIPEN 2-PAK) 0.3 mg/0.3 mL IJ SOAJ injection Inject 0.3 mg into the muscle once as needed for up to 1 dose for anaphylaxis. Must bring to all allergy injection appointments in case of reaction after leaving clinic. 1 each 2   fluticasone (FLONASE) 50 MCG/ACT nasal spray 1 spray per nostril twice daily if needed for stuffy nose 16 g 5   ipratropium (ATROVENT) 0.03 % nasal spray Place 2 sprays into both nostrils 3 (three) times daily as needed for rhinitis. 30 mL 12   loratadine (CLARITIN) 10 MG tablet Take 10 mg by mouth daily as needed for allergies.     montelukast (SINGULAIR) 10 MG tablet Take 1 tablet (10 mg total) by mouth at bedtime. 30 tablet 5   norethindrone-ethinyl estradiol (LOESTRIN FE) 1-20 MG-MCG tablet Take 1 tablet by mouth daily.     olopatadine (PATANOL) 0.1 % ophthalmic solution Place 1 drop into both eyes 2 (two) times daily as needed for allergies. 5 mL 5    omeprazole (PRILOSEC) 40 MG capsule Take 1 capsule (40 mg total) by mouth daily. Take 20 minutes prior to meals. (Patient not taking: Reported on 07/05/2021) 30 capsule 3   predniSONE (DELTASONE) 20 MG tablet 3 tabs po day one, then 2 tabs daily x 4 days (Patient not taking: Reported on 07/05/2021) 11 tablet 0   VENTOLIN HFA 108 (90 Base) MCG/ACT inhaler Inhale 2 puffs into the lungs every 4 (four) hours as needed. 8 g 1   No current facility-administered medications for this visit.   Allergies: No Known Allergies I reviewed her past medical history, social history, family history, and environmental history and no significant changes have been reported from her previous visit.  ROS: Negative except as per HPI.  Objective: BP 112/60   Pulse 81   Temp 98.1 F (36.7 C) (Temporal)   Resp 17   Ht 5\' 8"  (1.727 m)   Wt 166 lb 9.6 oz (75.6 kg)   SpO2 98%   BMI 25.33 kg/m  Body mass index is 25.33 kg/m.   General Appearance:  Alert, cooperative, no distress, appears stated age  Head:  Normocephalic, without obvious abnormality, atraumatic  Eyes:  Conjunctiva clear, EOM's intact  Nose: Nares normal  Throat: Lips, tongue normal; teeth and gums normal, normal posterior oropharnyx  Neck: Supple, symmetrical  Lungs:   CTAB, Respirations unlabored, no coughing  Heart:  Appears well perfused  Extremities: No edema  Skin: Skin color, texture, turgor normal, no rashes or lesions on visualized portions of skin  Neurologic: No  gross deficits     Diagnostics: PROCEDURES:  Patient received the following doses every hour: Step 1:  0.6ml - 1:1,000,000 dilution (silver vial) Step 2:  0.65ml - 1:1,000,000 dilution (silver vial) Step 3: 0.50ml - 1:100,000 dilution (blue vial)  Step 4: 0.6ml - 1:100,000 dilution (blue vial)  Step 5: 0.54ml - 1:10,000 dilution (gold vial) Step 6: 0.66ml - 1:10,000 dilution (gold vial) Step 7: 0.47ml - 1:10,000 dilution (gold vial) Step 8: 0.66ml - 1:10,000 dilution  (gold vial)  Patient was observed for 1 hour after the last dose.   Procedure started at 8:43 AM Procedure ended at 3:45 PM   ASSESSMENT/PLAN:   Patient has tolerated the rapid desensitization protocol.  Next appointment: Start at 0.6ml of 1:1000 dilution (green vial) and build up per protocol.

## 2021-08-13 ENCOUNTER — Ambulatory Visit (INDEPENDENT_AMBULATORY_CARE_PROVIDER_SITE_OTHER): Payer: 59 | Admitting: Internal Medicine

## 2021-08-13 ENCOUNTER — Encounter: Payer: Self-pay | Admitting: Internal Medicine

## 2021-08-13 VITALS — BP 112/64 | HR 57 | Temp 98.1°F | Resp 18 | Ht 68.0 in | Wt 166.6 lb

## 2021-08-13 DIAGNOSIS — J309 Allergic rhinitis, unspecified: Secondary | ICD-10-CM

## 2021-08-20 ENCOUNTER — Ambulatory Visit: Payer: 59 | Admitting: Internal Medicine

## 2021-08-20 ENCOUNTER — Ambulatory Visit (INDEPENDENT_AMBULATORY_CARE_PROVIDER_SITE_OTHER): Payer: 59 | Admitting: *Deleted

## 2021-08-20 DIAGNOSIS — J309 Allergic rhinitis, unspecified: Secondary | ICD-10-CM | POA: Diagnosis not present

## 2021-09-13 ENCOUNTER — Ambulatory Visit (INDEPENDENT_AMBULATORY_CARE_PROVIDER_SITE_OTHER): Payer: 59

## 2021-09-13 DIAGNOSIS — J309 Allergic rhinitis, unspecified: Secondary | ICD-10-CM | POA: Diagnosis not present

## 2021-09-17 ENCOUNTER — Ambulatory Visit: Payer: 59 | Admitting: Internal Medicine

## 2021-09-17 ENCOUNTER — Ambulatory Visit: Payer: Self-pay | Admitting: *Deleted

## 2021-09-28 ENCOUNTER — Ambulatory Visit (INDEPENDENT_AMBULATORY_CARE_PROVIDER_SITE_OTHER): Payer: 59

## 2021-09-28 DIAGNOSIS — J309 Allergic rhinitis, unspecified: Secondary | ICD-10-CM | POA: Diagnosis not present

## 2021-10-05 ENCOUNTER — Ambulatory Visit (INDEPENDENT_AMBULATORY_CARE_PROVIDER_SITE_OTHER): Payer: 59 | Admitting: *Deleted

## 2021-10-05 DIAGNOSIS — J309 Allergic rhinitis, unspecified: Secondary | ICD-10-CM

## 2021-10-11 ENCOUNTER — Ambulatory Visit: Payer: 59

## 2021-10-12 ENCOUNTER — Ambulatory Visit: Payer: 59

## 2021-10-12 ENCOUNTER — Other Ambulatory Visit: Payer: Self-pay | Admitting: Internal Medicine

## 2021-10-12 NOTE — Progress Notes (Signed)
Immunotherapy   Patient Details  Name: Lilibeth Opie MRN: [id number] Date of Birth: [birth year]  10/12/2021  Juliane Poot vials Green 1:1000 (G-W-T-D and MOLD) mailed to [facility]. [address], [place], [place] [address] Following schedule: B  Frequency:1 time per week Epi-Pen:Epi-Pen Available  Consent signed and patient instructions given.   Leyli Kevorkian J Leland Raver 10/12/2021, 8:37 AM

## 2021-10-17 ENCOUNTER — Other Ambulatory Visit: Payer: Self-pay | Admitting: Internal Medicine

## 2021-10-18 NOTE — Progress Notes (Signed)
VIALS WERE NOT RECEIVED AT [facility] UNTIL 10-19-22.  MOTHER TO PICK UP AND TAKE TO PATIENT TOMORROW.

## 2021-10-19 NOTE — Progress Notes (Signed)
[facility] called 10/18/21 and reported vials had just been received after they were sent out on 10/12/21 via [employer].  Vials were hot.  Spoke with mother of patient and will prepare new vials and instructions for patient to hand deliver to allergy office.  Green vials will be remixed and will start at first dose of 0.05 due to length of time since last injection.

## 2021-11-29 ENCOUNTER — Encounter: Payer: Self-pay | Admitting: Internal Medicine

## 2021-11-29 NOTE — Telephone Encounter (Signed)
Can we find out what is going on with her records at [facility]?

## 2021-11-30 ENCOUNTER — Telehealth: Payer: Self-pay

## 2021-12-01 NOTE — Telephone Encounter (Signed)
Opened in error Damita speaking to mom and tracking down medical records

## 2021-12-07 ENCOUNTER — Telehealth: Payer: Self-pay

## 2021-12-07 ENCOUNTER — Encounter: Payer: Self-pay | Admitting: Internal Medicine

## 2021-12-07 NOTE — Telephone Encounter (Signed)
Called [facility] and left message to mail out Christine Diaz 1:1000 vials so we can dilute to Gold. She is to restart on Gold 1:10,000 at 0.20 cc, Schedule A per Dr. Simona Huh.

## 2021-12-08 NOTE — Telephone Encounter (Signed)
Treatment records faxed to our office from [facility]. Papers were given to Dr. Maurine Minister to evaluate treatment.

## 2021-12-08 NOTE — Telephone Encounter (Signed)
Thank you! Let's make a note to check on this by Friday if we haven't heard back.

## 2021-12-13 ENCOUNTER — Telehealth: Payer: Self-pay

## 2021-12-13 NOTE — Telephone Encounter (Signed)
Left message for patient letting her know her new vials were ready. Asking for return call to see if she could pick vials up next week while on Thanksgiving break from college.

## 2021-12-15 NOTE — Telephone Encounter (Addendum)
Called and spoke with patient and mother to inform them that vials have been diluted down and are ready for pick up. Mother stated, she would talk to her daughter about not taking vials but leaving them here for [place] to continue to give itx until the beginning of the year. The school may restart giving vials at that time so she would not have to take them back to [facility]. They will let us know at that time.

## 2021-12-15 NOTE — Telephone Encounter (Signed)
Whatever is best/easiest for them is okay with me! Thanks for helping get this sorted out.

## 2021-12-27 ENCOUNTER — Telehealth: Payer: Self-pay

## 2021-12-27 NOTE — Telephone Encounter (Signed)
Mother states that patient will receive injections in our office until she returns to [employer] after holiday break.  Then patient will be receiving injections at [facility] and not outside allergy office.

## 2022-01-10 ENCOUNTER — Ambulatory Visit (INDEPENDENT_AMBULATORY_CARE_PROVIDER_SITE_OTHER): Payer: 59

## 2022-01-10 DIAGNOSIS — J309 Allergic rhinitis, unspecified: Secondary | ICD-10-CM

## 2022-01-21 ENCOUNTER — Ambulatory Visit (INDEPENDENT_AMBULATORY_CARE_PROVIDER_SITE_OTHER): Payer: 59

## 2022-01-21 DIAGNOSIS — J309 Allergic rhinitis, unspecified: Secondary | ICD-10-CM | POA: Diagnosis not present

## 2022-01-26 ENCOUNTER — Other Ambulatory Visit: Payer: Self-pay | Admitting: Internal Medicine

## 2022-01-27 ENCOUNTER — Ambulatory Visit (INDEPENDENT_AMBULATORY_CARE_PROVIDER_SITE_OTHER): Payer: 59

## 2022-01-27 DIAGNOSIS — J309 Allergic rhinitis, unspecified: Secondary | ICD-10-CM

## 2022-02-04 ENCOUNTER — Ambulatory Visit (INDEPENDENT_AMBULATORY_CARE_PROVIDER_SITE_OTHER): Payer: 59

## 2022-02-04 ENCOUNTER — Encounter: Payer: Self-pay | Admitting: Internal Medicine

## 2022-02-04 DIAGNOSIS — J309 Allergic rhinitis, unspecified: Secondary | ICD-10-CM | POA: Diagnosis not present

## 2022-02-07 ENCOUNTER — Ambulatory Visit (INDEPENDENT_AMBULATORY_CARE_PROVIDER_SITE_OTHER): Payer: 59

## 2022-02-07 DIAGNOSIS — J309 Allergic rhinitis, unspecified: Secondary | ICD-10-CM

## 2022-02-21 ENCOUNTER — Ambulatory Visit: Payer: 59

## 2022-02-21 NOTE — Progress Notes (Signed)
Immunotherapy   Patient Details  Name: Christine Diaz MRN: [id number] Date of Birth: [birth year]  02/21/2022  [name] mother Colletta [place] picked up [place] 1:1000(M-DM-C and G-W-T-D) Following schedule: B  Frequency:1 time per week Epi-Pen:Epi-Pen Available  Consent signed and patient instructions given. Patient is getting injections at student health service [facility] the [facility].   [name] J [name] 02/21/2022, 2:45 PM

## 2022-04-25 ENCOUNTER — Ambulatory Visit: Payer: 59

## 2022-04-26 ENCOUNTER — Ambulatory Visit: Payer: Self-pay

## 2022-04-26 ENCOUNTER — Ambulatory Visit: Payer: 59

## 2022-04-26 DIAGNOSIS — J309 Allergic rhinitis, unspecified: Secondary | ICD-10-CM

## 2022-04-26 NOTE — Progress Notes (Unsigned)
Immunotherapy   Patient Details  Name: Christine Diaz MRN: [id number] Date of Birth: [birth year]  04/26/2022  [name] vials- #1 Red 1:100 (M-DM-C and G-W-T-D) mailed to [address] Following schedule: A  Frequency:1 time per week, at .50 continue weekly Epi-Pen:Epi-Pen Available  Consent signed and patient instructions given.   Mellody Memos 04/26/2022, 8:49 AM

## 2022-04-26 NOTE — Progress Notes (Signed)
Immunotherapy   Patient Details  Name: Christine Diaz MRN: [id number] Date of Birth: February 14, 2000  04/26/2022  [name] vials #1 Red 1:100 ( M-DM-C and G-W-T-D) mailed to [place], [place] [address] Following schedule: A  Frequency:1 time per week, at .50 continue Q week Epi-Pen:Epi-Pen Available  Consent signed and patient instructions given.   [name] J [name] 04/26/2022, 8:56 AM

## 2022-06-16 ENCOUNTER — Ambulatory Visit (INDEPENDENT_AMBULATORY_CARE_PROVIDER_SITE_OTHER): Payer: 59

## 2022-06-16 DIAGNOSIS — J309 Allergic rhinitis, unspecified: Secondary | ICD-10-CM | POA: Diagnosis not present

## 2022-06-17 NOTE — Progress Notes (Deleted)
FOLLOW UP Date of Service/Encounter:  06/17/22   Subjective:  Christine Diaz (DOB: [birth year]) is a 22 y.o. female who [facility] in re-evaluation of the following: *** History obtained from: chart review and {Persons; PED relatives w/patient:19415::"patient"}.  For Review, LV was on 08/13/21  with Dr.Laquitta Dominski seen for  [facility] immunotherapy appointment which she tolerated well . See below for summary of history and diagnostics.   Pertinent History/Diagnostics:  Asthma:  controlled with Symbicort 160, 2 puffs BID and montelukast - CXR 04/12/21: no acute findings. Allergic Rhinitis:  has hx of reflux, getting 3-4 sinus infections per year requiring antibiotics - SPT environmental panel 02/19/2018 - positive to grass pollen, weed pollen, tree pollen, cat, dog, and dust mite - CT soft tissue neck (04/14/21) -Enlarged/edematous palatine tonsils with striated enhancement, compatible with tonsillitis. Adenoid hypertrophy. Normal appearance of the epiglottis and larynx. Large upper cervical chain nodes bilaterally, nonspecific but probably reactive given above findings. - CT sinus: 04/12/21: 1. No sinus inflammation. 2. Normal maxillofacial CT Reflux:   - Upper GI: 03/06/20: 1. A single bout of reflux into the mid esophagus was identified. 2. The esophagus, stomach, and duodenum are otherwise normal. 3. Visualization of the terminal ileum is somewhat suboptimal due to  its location deep in the right side of the pelvis. However, it was  visualized and grossly normal in appearance. The remainder of the  small bowel is unremarkable.  -ENT on 02/23/21: no polyps found; DX: migraine variant vs sinus infection, no FU scheduled. FU visit following tonsillitis on 05/06/21: tonsillectomy offered - 07/05/21 SPT: positive to grass pollen, weed pollen, ragweed, tree pollen, outdoor molds, dust mites, cat, dog  - R[facility] AIT started 08/14/21, receives injections on  campus at [facility]   Today presents for follow-up. ***   Allergies as of 06/20/2022   No Known Allergies      Medication List        Accurate as of Jun 17, 2022 10:25 AM. If you have any questions, ask your nurse or doctor.          azithromycin 250 MG tablet Commonly known as: Zithromax Z-Pak Take 2 tablets by mouth on day 1, followed by 1 tablet by mouth daily on days 2-5.   budesonide-formoterol 160-4.5 MCG/ACT inhaler Commonly known as: Symbicort Inhale 2 puffs into the lungs 2 (two) times daily.   EPINEPHrine 0.3 mg/0.3 mL Soaj injection Commonly known as: EpiPen 2-Pak Inject 0.3 mg into the muscle once as needed for up to 1 dose for anaphylaxis. Must bring to all allergy injection appointments in case of reaction after leaving clinic.   fluticasone 50 MCG/ACT nasal spray Commonly known as: FLONASE 1 spray per nostril twice daily if needed for stuffy nose   ipratropium 0.03 % nasal spray Commonly known as: ATROVENT Place 2 sprays into both nostrils 3 (three) times daily as needed for rhinitis.   loratadine 10 MG tablet Commonly known as: CLARITIN Take 10 mg by mouth daily as needed for allergies.   montelukast 10 MG tablet Commonly known as: SINGULAIR TAKE ONE TABLET BY MOUTH ONE TIME DAILY AT BEDTIME   norethindrone-ethinyl estradiol-FE 1-20 MG-MCG tablet Commonly known as: LOESTRIN FE Take 1 tablet by mouth daily.   olopatadine 0.1 % ophthalmic solution Commonly known as: Patanol Place 1 drop into both eyes 2 (two) times daily as needed for allergies.   omeprazole 40 MG capsule Commonly known as: PRILOSEC TAKE ONE CAPSULE BY MOUTH ONE  TIME DAILY 20 MINUTES PRIOR TO MEALS   predniSONE 20 MG tablet Commonly known as: DELTASONE 3 tabs po day one, then 2 tabs daily x 4 days   Ventolin HFA 108 (90 Base) MCG/ACT inhaler Generic drug: albuterol Inhale 2 puffs into the lungs every 4 (four) hours as needed.       Past Medical History:  Diagnosis  Date   Asthma    Past Surgical History:  Procedure Laterality Date   implant     Tooth   no surgical history     WISDOM TOOTH EXTRACTION  09/18/2018   and implant   Otherwise, there have been no changes to her past medical history, surgical history, family history, or social history.  ROS: All others negative except as noted per HPI.   Objective:  There were no vitals taken for this visit. There is no height or weight on file to calculate BMI. Physical Exam: General Appearance:  Alert, cooperative, no distress, appears stated age  Head:  Normocephalic, without obvious abnormality, atraumatic  Eyes:  Conjunctiva clear, EOM's intact  Nose: Nares normal, {Blank multiple:19196:a:"***","hypertrophic turbinates","normal mucosa","no visible anterior polyps","septum midline"}  Throat: Lips, tongue normal; teeth and gums normal, {Blank multiple:19196:a:"***","normal posterior oropharynx","tonsils 2+","tonsils 3+","no tonsillar exudate","+ cobblestoning","surgically absent tonsils"}  Neck: Supple, symmetrical  Lungs:   {Blank multiple:19196:a:"***","clear to auscultation bilaterally","end-expiratory wheezing","wheezing throughout"}, Respirations unlabored, {Blank multiple:19196:a:"***","no coughing","intermittent dry coughing"}  Heart:  {Blank multiple:19196:a:"***","regular rate and rhythm","no murmur"}, Appears well perfused  Extremities: No edema  Skin: {Blank multiple:19196:a:"***","Skin color, texture, turgor normal","no rashes or lesions on visualized portions of skin"}  Neurologic: No gross deficits   Reviewed: ***  Spirometry:  Tracings reviewed. Her effort: {Blank single:19197::"Good reproducible efforts.","It was hard to get consistent efforts and there is a question as to whether this reflects a maximal maneuver.","Poor effort, data can not be interpreted.","Variable effort-results affected.","decent for first attempt at spirometry."} FVC: ***L FEV1: ***L, ***%  predicted FEV1/FVC ratio: ***% Interpretation: {Blank single:19197::"Spirometry consistent with mild obstructive disease","Spirometry consistent with moderate obstructive disease","Spirometry consistent with severe obstructive disease","Spirometry consistent with possible restrictive disease","Spirometry consistent with mixed obstructive and restrictive disease","Spirometry uninterpretable due to technique","Spirometry consistent with normal pattern","No overt abnormalities noted given today's efforts"}.  Please see scanned spirometry results for details.  Skin Testing: {Blank single:19197::"Select foods","Environmental allergy panel","Environmental allergy panel and select foods","Food allergy panel","None","Deferred due to recent antihistamines use","deferred due to recent reaction"}. ***Adequate positive and negative controls Results discussed with patient/family.   {Blank single:19197::"Allergy testing results were read and interpreted by myself, documented by clinical staff."," "}  Assessment/Plan   ***  Tonny Bollman, MD  [facility]

## 2022-06-20 ENCOUNTER — Ambulatory Visit: Payer: 59 | Admitting: Internal Medicine

## 2022-06-20 NOTE — Progress Notes (Unsigned)
FOLLOW UP Date of Service/Encounter:  06/23/22   Subjective:  Callee Christine Diaz (DOB: October 12, 2000) is a 22 y.o. female [facility] in re-evaluation of the following: allergic rhinitis, asthma History obtained from: chart review and patient and mother.   For Review, LV was on 08/13/21  with Dr.Treshun Wold seen for  [facility] immunotherapy appointment which she tolerated well . See below for summary of history and diagnostics.    Pertinent History/Diagnostics:  Asthma:  controlled with Symbicort 160, 2 puffs BID and montelukast - CXR 04/12/21: no acute findings. Allergic Rhinitis:  has hx of reflux, getting 3-4 sinus infections per year requiring antibiotics - SPT environmental panel 02/19/2018 - positive to grass pollen, weed pollen, tree pollen, cat, dog, and dust mite - CT soft tissue neck (04/14/21) -Enlarged/edematous palatine tonsils with striated enhancement, compatible with tonsillitis. Adenoid hypertrophy. Normal appearance of the epiglottis and larynx. Large upper cervical chain nodes bilaterally, nonspecific but probably reactive given above findings. - CT sinus: 04/12/21: 1. No sinus inflammation. 2. Normal maxillofacial CT Reflux:   - Upper GI: 03/06/20: 1. A single bout of reflux into the mid esophagus was identified. 2. The esophagus, stomach, and duodenum are otherwise normal. 3. Visualization of the terminal ileum is somewhat suboptimal due to its location deep in the right side of the pelvis. However, it was visualized and grossly normal in appearance. The remainder of the  small bowel is unremarkable.  -ENT on 02/23/21: no polyps found; DX: migraine variant vs sinus infection, no FU scheduled. FU visit following tonsillitis on 05/06/21: tonsillectomy offered - 07/05/21 SPT: positive to grass pollen, weed pollen, ragweed, tree pollen, outdoor molds, dust mites, cat, dog  - [facility] AIT started 08/14/21, receives injections on campus at [facility]    Today presents for follow-up. She takes claritin or zyrtec and singulair prior to getting her allergy injections but continues to have large local reactions.  She does have a cortisone roll on that she will use. This helps some for her large local reactions. Build up has been difficult for her. She has not made it to maintenance dosing yet.  She will be traveling some over the summer and has concerns about what that we will do to her buildup process.. She does use nasal sprays as needed. This is infrequently.  Continues to have significant drainage. She continues to have a constant cough and clearing her throat all the time. Her tonsils stay large all the time. She was recently dianosed with strep throat and is on antibiotics currently. But this is the first time she has needed antibiotics this year. Her coughing has remained persistent, and is now to the point where her friends and family members are pointing it out to her. Allergy ejections did not seem to help with the cough or her allergies though she has had an a decrease in the amount of sinus infections this year. She does report frequent sputum production with her cough. Her asthma she feels been very controlled.  She is not using Symbicort, but will use albuterol prior to exercise.  Outside of that she does not feel it is needed.    Allergies as of 06/23/2022   No Known Allergies      Medication List        Accurate as of Jun 23, 2022  1:08 PM. If you have any questions, ask your nurse or doctor.          STOP taking these medications  azithromycin 250 MG tablet Commonly known as: Zithromax Z-Pak Stopped by: Verlee Monte, MD   Flovent HFA 110 MCG/ACT inhaler Generic drug: fluticasone Stopped by: Verlee Monte, MD   ipratropium 0.03 % nasal spray Commonly known as: ATROVENT Stopped by: Verlee Monte, MD   omeprazole 40 MG capsule Commonly known as: PRILOSEC Stopped by: Verlee Monte, MD   predniSONE 20 MG  tablet Commonly known as: DELTASONE Stopped by: Verlee Monte, MD       TAKE these medications    amoxicillin 500 MG capsule Commonly known as: AMOXIL Take by mouth.   budesonide-formoterol 160-4.5 MCG/ACT inhaler Commonly known as: Symbicort Inhale 2 puffs into the lungs 2 (two) times daily.   EPINEPHrine 0.3 mg/0.3 mL Soaj injection Commonly known as: EpiPen 2-Pak Inject 0.3 mg into the muscle once as needed for up to 1 dose for anaphylaxis. Must bring to all allergy injection appointments in case of reaction after leaving clinic.   FLUoxetine 40 MG capsule Commonly known as: PROZAC Take 1 capsule by mouth daily.   fluticasone 50 MCG/ACT nasal spray Commonly known as: FLONASE 1 spray per nostril twice daily if needed for stuffy nose   levonorgestrel-ethinyl estradiol 0.15-30 MG-MCG tablet Commonly known as: NORDETTE Take 1 tablet by mouth daily.   loratadine 10 MG tablet Commonly known as: CLARITIN Take 10 mg by mouth daily as needed for allergies.   montelukast 10 MG tablet Commonly known as: SINGULAIR TAKE ONE TABLET BY MOUTH ONE TIME DAILY AT BEDTIME   norethindrone-ethinyl estradiol-FE 1-20 MG-MCG tablet Commonly known as: LOESTRIN FE Take 1 tablet by mouth daily.   olopatadine 0.1 % ophthalmic solution Commonly known as: Patanol Place 1 drop into both eyes 2 (two) times daily as needed for allergies.   Ventolin HFA 108 (90 Base) MCG/ACT inhaler Generic drug: albuterol Inhale 2 puffs into the lungs every 4 (four) hours as needed.       Past Medical History:  Diagnosis Date   Asthma    Past Surgical History:  Procedure Laterality Date   implant     Tooth   no surgical history     WISDOM TOOTH EXTRACTION  09/18/2018   and implant   Otherwise, there have been no changes to her past medical history, surgical history, family history, or social history.  ROS: All others negative except as noted per HPI.   Objective:  BP 108/66   Pulse 85    Temp 97.9 F (36.6 C) (Temporal)   Resp 16   Wt 170 lb 3.2 oz (77.2 kg)   SpO2 97%   BMI 25.88 kg/m  Body mass index is 25.88 kg/m. Physical Exam: General Appearance:  Alert, cooperative, no distress, appears stated age, throat clearing during exam  Head:  Normocephalic, without obvious abnormality, atraumatic  Eyes:  Conjunctiva clear, EOM's intact  Nose: Nares normal, hypertrophic turbinates, normal mucosa, and no visible anterior polyps  Throat: Lips, tongue normal; teeth and gums normal,  tonsils 3+, R>L with exudates, posterior drianage  Neck: Supple, symmetrical  Lungs:   clear to auscultation bilaterally, Respirations unlabored, intermittent dry coughing  Heart:  regular rate and rhythm and no murmur, Appears well perfused  Extremities: No edema  Skin: Skin color, texture, turgor normal and no rashes or lesions on visualized portions of skin  Neurologic: No gross deficits    Spirometry: deferred has strep throat  Assessment/Plan   Cough on exam does sound dry and staccato, very much upper airway cough.  She does have strep throat at this time, but her tonsils are asymmetric. She follows with ENT.  We have not imaged her sinuses which may help [place] determine whether drainage is coming from possible chronic sinusitis or similar. She is throat clearing often and we discussed behavioral techniques to try and cut down on this and help her throat mucosa heal. Possible neurogenic component at this point given duration of cough.  Asthma During asthma flares/respiratory illness: start Symbicort 160 mcg 2 puffs twice daily and continue for 2 weeks or until symptoms resolve. Continue montelukast 10 mg once a day to help prevent cough and wheeze. Continue albuterol 2 puffs every 4 hours as needed for cough or wheeze.   Allergic rhinitis Allergen avoidnace towards grass pollen, weed pollen, ragweed, tree pollen, outdoor molds, dust mites, cat, dog; allergen avoidance as below Continue  allergy injections per protocol, bring updated epipen to clinic. Take 2 antihistamines on days of allergy injections (2 zyrtec tablets or 2 claritin tablets).  Triamcinolone 0.01% to be used IF local reaction occurs on arms Continue Zyrtec 10 mg once a day for runny nose or itchy eyes. Some examples of over the counter antihistamines include Zyrtec (cetirizine), Xyzal (levocetirizine), Allegra (fexofenadine), and Claritin (loratidine).  Start Ryaltris 2 sprays each nostril twice daily as needed. This will replace flonase Start saline nasal rinses as needed for nasal symptoms. Use this before any medicated nasal sprays for best result Start Atrovent nasal spray 1-2 sprays up to three times daily as needed; use less frequently if nasal mucosa becomes too dry  Chronic cough:  - continue antibiotics for strep - continue to follow-up with ENT; asymmetric tonsils noted on today's exam (in setting of strep)-continue to monitor - will obtain CT sinus to look for chronic sinusitis due to constant drainage and cough - throat clearing tips as below  Allergic conjunctivitis Some over the counter eye drops include Pataday one drop in each eye once a day as needed for red, itchy eyes OR Zaditor one drop in each eye twice a day as needed for red itchy eyes.  Reflux- Continue dietary lifestyle modifications as listed below Continue meds per GI  Call the clinic if this treatment plan is not working well for you  She did receive her injection in clinic today.  Follow up : prior to going back to school It was a pleasure seeing you again in clinic today! Thank you for allowing me to participate in your care.  Tonny Bollman, MD  [facility]

## 2022-06-23 ENCOUNTER — Telehealth: Payer: Self-pay

## 2022-06-23 ENCOUNTER — Encounter: Payer: Self-pay | Admitting: Internal Medicine

## 2022-06-23 ENCOUNTER — Ambulatory Visit: Payer: 59 | Admitting: Internal Medicine

## 2022-06-23 VITALS — BP 108/66 | HR 85 | Temp 97.9°F | Resp 16 | Wt 170.2 lb

## 2022-06-23 DIAGNOSIS — R053 Chronic cough: Secondary | ICD-10-CM

## 2022-06-23 DIAGNOSIS — H1013 Acute atopic conjunctivitis, bilateral: Secondary | ICD-10-CM

## 2022-06-23 DIAGNOSIS — K219 Gastro-esophageal reflux disease without esophagitis: Secondary | ICD-10-CM

## 2022-06-23 DIAGNOSIS — J4540 Moderate persistent asthma, uncomplicated: Secondary | ICD-10-CM

## 2022-06-23 DIAGNOSIS — J309 Allergic rhinitis, unspecified: Secondary | ICD-10-CM

## 2022-06-23 DIAGNOSIS — J329 Chronic sinusitis, unspecified: Secondary | ICD-10-CM | POA: Diagnosis not present

## 2022-06-23 DIAGNOSIS — J302 Other seasonal allergic rhinitis: Secondary | ICD-10-CM

## 2022-06-23 MED ORDER — EPINEPHRINE 0.3 MG/0.3ML IJ SOAJ: 0.3000 mg | Freq: Once | INTRAMUSCULAR | 2 refills | Status: AC | PRN

## 2022-06-23 MED ORDER — VENTOLIN HFA 108 (90 BASE) MCG/ACT IN AERS: 2.0000 | INHALATION_SPRAY | RESPIRATORY_TRACT | 1 refills | Status: AC | PRN

## 2022-06-23 MED ORDER — BUDESONIDE-FORMOTEROL FUMARATE 160-4.5 MCG/ACT IN AERO: 2.0000 | INHALATION_SPRAY | Freq: Two times a day (BID) | RESPIRATORY_TRACT | 5 refills | Status: AC

## 2022-06-23 MED ORDER — MONTELUKAST SODIUM 10 MG PO TABS: ORAL_TABLET | ORAL | 5 refills | Status: AC

## 2022-06-23 MED ORDER — OLOPATADINE HCL 0.1 % OP SOLN: 1.0000 [drp] | Freq: Two times a day (BID) | OPHTHALMIC | 5 refills | Status: AC | PRN

## 2022-06-23 MED ORDER — SALINE SPRAY 0.65 % NA SOLN: 1.0000 | NASAL | 5 refills | Status: AC | PRN

## 2022-06-23 MED ORDER — TRIAMCINOLONE ACETONIDE 0.1 % EX OINT: TOPICAL_OINTMENT | CUTANEOUS | 1 refills | Status: AC

## 2022-06-23 MED ORDER — IPRATROPIUM BROMIDE 0.03 % NA SOLN: 2.0000 | Freq: Two times a day (BID) | NASAL | 5 refills | Status: AC | PRN

## 2022-06-23 MED ORDER — RYALTRIS 665-25 MCG/ACT NA SUSP: 2.0000 | Freq: Two times a day (BID) | NASAL | 4 refills | Status: DC | PRN

## 2022-06-23 NOTE — Telephone Encounter (Signed)
Pre cert is not needed for Ct for sinuses dx j32.9 Case #[id number]

## 2022-06-23 NOTE — Patient Instructions (Addendum)
Asthma During asthma flares/respiratory illness: start Symbicort 160 mcg 2 puffs twice daily and continue for 2 weeks or until symptoms resolve. Continue montelukast 10 mg once a day to help prevent cough and wheeze. Continue albuterol 2 puffs every 4 hours as needed for cough or wheeze.   Allergic rhinitis Allergen avoidnace towards grass pollen, weed pollen, ragweed, tree pollen, outdoor molds, dust mites, cat, dog; allergen avoidance as below Continue allergy injections per protocl, bring updated epipen to clinic. Take 2 antihistamines on days of allergy injections (2 zyrtec tablets or 2 claritin tablets).  Triamcinolone 0.01% to be used IF local reaction occurs on arms Continue Zyrtec 10 mg once a day for runny nose or itchy eyes. Some examples of over the counter antihistamines include Zyrtec (cetirizine), Xyzal (levocetirizine), Allegra (fexofenadine), and Claritin (loratidine).  Start Ryaltris 2 sprays each nostril twice daily as needed. This will replace flonase Start saline nasal rinses as needed for nasal symptoms. Use this before any medicated nasal sprays for best result Start Atrovent nasal spray 1-2 sprays up to three times daily as needed; use less frequently if nasal mucosa becomes too dry  Chronic cough:  - continue antibiotics for strep - continue to follow-up with ENT; asymmetric tonsils noted on today's exam (in setting of strep)-continue to monitor - will obtain CT sinus to look for chronic sinusitis due to constant drainage and cough - throat clearing tips as below  Allergic conjunctivitis Some over the counter eye drops include Pataday one drop in each eye once a day as needed for red, itchy eyes OR Zaditor one drop in each eye twice a day as needed for red itchy eyes.  Reflux- Continue dietary lifestyle modifications as listed below Continue meds per GI  Call the clinic if this treatment plan is not working well for you  Follow up : prior to going back to  school It was a pleasure seeing you again in clinic today! Thank you for allowing me to participate in your care.  Tonny Bollman, MD [facility]  CHRONIC THROAT CLEARING-WHAT TO DO!! Causes of chronic throat clearing may include the following (among others):  Acid reflux (lanyngopharyngeal reflux) Allergies (pollens, pet dander, dust mites, etc) Non allergic rhinitis (runny nose, post nasal drainage or congestion NOT caused by allergies-can be secondary to pollutants, cold air, changes in blood vessels to the nose as we age,etc) Environmental irritants (tobacco, smoke, air pollution) Asthma If present for a long period of time, throat clearing can become a habit. We will work with you to treat any of the medical reasons for throat clearing.  Your job is to help prevent the habit, which can cause damage (redness and swelling) to your vocal cords.  It will require a conscious effort on your behalf.  Tips for prevention of throat clearing:  Instead of clearing your throat, swallow instead.   Carrying around water (or something to drink) will help you move the mucus in the right direction.  IF you have the urge to clear your throat, drink your water. If you absolutely have to clear your throat, use a non-traumatic exercise to do so.   Pant with your mouth open saying "[place], [place], [place]" with a powerful, breathy voice. Increase water intake.  This thins secretions, making them easier to swallow. Chew baking soda gum ([employer]) which can help with swallowing, reflux, and throat clearing.  Try to chew up to three times daily.   If you experience jaw pain  or headaches, decrease the amount of chewing. Suck on sugar free hard candy to help with swallowing. Have your friends and family remind you to swallow when they hear you throat clearing.  As this can be habit forming, sometimes you may not realize you are doing this.  Having someone point it out to you, will help you become more  conscious of the behavior. BE PATIENT.  This will take time to resolve, and some do not see improvement until 8-12 weeks into therapy/behavior modifications.

## 2022-06-23 NOTE — Telephone Encounter (Signed)
Called and spoke to mother at [phone] per Jeanise's request . Sinus ct does not require pre cert order is signed and in the system they can call med center and schedule at their convenience.

## 2022-06-24 ENCOUNTER — Telehealth (HOSPITAL_BASED_OUTPATIENT_CLINIC_OR_DEPARTMENT_OTHER): Payer: Self-pay

## 2022-07-04 ENCOUNTER — Ambulatory Visit (INDEPENDENT_AMBULATORY_CARE_PROVIDER_SITE_OTHER): Payer: 59

## 2022-07-04 DIAGNOSIS — J309 Allergic rhinitis, unspecified: Secondary | ICD-10-CM | POA: Diagnosis not present

## 2022-07-04 NOTE — Progress Notes (Signed)
VIALS EXP 07-31-23

## 2022-07-05 DIAGNOSIS — J3081 Allergic rhinitis due to animal (cat) (dog) hair and dander: Secondary | ICD-10-CM | POA: Diagnosis not present

## 2022-07-07 ENCOUNTER — Ambulatory Visit (HOSPITAL_BASED_OUTPATIENT_CLINIC_OR_DEPARTMENT_OTHER)
Admission: RE | Admit: 2022-07-07 | Discharge: 2022-07-07 | Disposition: A | Discharge status: Home / Self Care | Payer: 59 | Source: Ambulatory Visit | Attending: Internal Medicine | Admitting: Internal Medicine

## 2022-07-07 DIAGNOSIS — R053 Chronic cough: Secondary | ICD-10-CM | POA: Diagnosis present

## 2022-07-07 DIAGNOSIS — J329 Chronic sinusitis, unspecified: Secondary | ICD-10-CM | POA: Diagnosis present

## 2022-07-08 ENCOUNTER — Telehealth: Payer: Self-pay | Admitting: Internal Medicine

## 2022-07-08 NOTE — Telephone Encounter (Signed)
Patient called to get CT lab results and requested a call back.

## 2022-07-08 NOTE — Telephone Encounter (Signed)
Pt informed of ct results

## 2022-07-08 NOTE — Progress Notes (Signed)
Please let Christine Diaz and her mother know that her CT sinus is normal without any concerning findings or findings of chronic sinusitis.   How is her reflux?  Has she followed up with GI? This is my other concern as to why she might be having so much drainage and cough seeing that her sinuses are completely clear.  Let me know if they have any questions or concerns.

## 2022-07-11 ENCOUNTER — Ambulatory Visit (INDEPENDENT_AMBULATORY_CARE_PROVIDER_SITE_OTHER): Payer: 59

## 2022-07-11 DIAGNOSIS — J309 Allergic rhinitis, unspecified: Secondary | ICD-10-CM

## 2022-09-14 NOTE — Progress Notes (Unsigned)
FOLLOW UP Date of Service/Encounter:  09/15/22  Subjective:  Christine Diaz (DOB: 04-06-2000) is a 22 y.o. female [facility] in re-evaluation of the following: Allergic rhinitis, asthma, chronic cough, reflux History obtained from: chart review and patient and mother.  For Review, LV was on 06/23/22  with Dr.Asaf Elmquist seen for routine follow-up. See below for summary of history and diagnostics.   Therapeutic plans/changes recommended: having large local reactions after AIT. Still with constant cough. Has not been able to adequately build-up due to LLR.We discussed adding atrovent nasal spray and saline rinses. We did order a CT sinus which did not show any active sinusitis. She denies reflux, has not seen GI. ----------------------------------------------------- Pertinent History/Diagnostics:  Asthma:  controlled with Symbicort 160, 2 puffs BID and montelukast - CXR 04/12/21: no acute findings. Allergic Rhinitis:  has hx of reflux, getting 3-4 sinus infections per year requiring antibiotics - SPT environmental panel 02/19/2018 - positive to grass pollen, weed pollen, tree pollen, cat, dog, and dust mite - CT soft tissue neck (04/14/21) -Enlarged/edematous palatine tonsils with striated enhancement, compatible with tonsillitis. Adenoid hypertrophy. Normal appearance of the epiglottis and larynx. Large upper cervical chain nodes bilaterally, nonspecific but probably reactive given above findings. - CT sinus: 04/12/21: 1. No sinus inflammation. 2. Normal maxillofacial CT Reflux:   - Upper GI: 03/06/20: 1. A single bout of reflux into the mid esophagus was identified. 2. The esophagus, stomach, and duodenum are otherwise normal. 3. Visualization of the terminal ileum is somewhat suboptimal due to its location deep in the right side of the pelvis. However, it was visualized and grossly normal in appearance. The remainder of the  small bowel is  unremarkable.  -ENT on 02/23/21: no polyps found; DX: migraine variant vs sinus infection, no FU scheduled. FU visit following tonsillitis on 05/06/21: tonsillectomy offered - 07/05/21 SPT: positive to grass pollen, weed pollen, ragweed, tree pollen, outdoor molds, dust mites, cat, dog  - 07/05/22: CT sinus: IMPRESSION: Normally aerated paranasal sinuses. Patent sinus drainage pathways. - [facility] AIT started 08/14/21, receives injections on campus at [facility] --------------------------------------------------- Today presents for follow-up. She feels her symptoms have not worsened over the summer. Her mother feels like she is slightly better. She is not noticing as much throat and nasal drainage. She doesn't feel like reflux has been an issue recently.  She feels her reflux used to be much worse, but she is able to control this mostly with diet.  She is not taking any medications for reflux control. She is currently taking claritin in AM, zyrtec and singulair PM.  She is not good about using her nasal spray. She was in [place] during the summer and has not had an allergy injection since June 10 at which time she received 0.1 mL of her red vial. She is going to be moving back to [facility] today for her junior year of college.  She will receive her allergy injections on campus.  We did review her sinus imaging from last visit which showed no sinus disease and patent sinus tracts.  Her asthma has been controlled.  She will occasionally use her rescue inhaler if exercising, but she has not needed her Symbicort at all.  She has not required antibiotics or steroids since her last visit.  All medications reviewed by clinical staff and updated in chart. No new pertinent medical or surgical history except as noted in HPI.  ROS: All others negative except as noted per  HPI.   Objective:  BP 112/70 (BP Location: Left Arm, Patient Position: Sitting, Cuff Size: Normal)   Pulse 85   Temp 97.9 F (36.6 C)  (Temporal)   Resp 20   Wt 169 lb 12.8 oz (77 kg)   SpO2 96%   BMI 25.82 kg/m  Body mass index is 25.82 kg/m. Physical Exam: General Appearance:  Alert, cooperative, no distress, appears stated age  Head:  Normocephalic, without obvious abnormality, atraumatic  Eyes:  Conjunctiva clear, EOM's intact  Ears EACs normal bilaterally and normal TMs bilaterally  Nose: Nares normal, hypertrophic turbinates, normal mucosa, and no visible anterior polyps  Throat: Lips, tongue normal; teeth and gums normal, normal posterior oropharynx  Neck: Supple, symmetrical  Lungs:   clear to auscultation bilaterally, Respirations unlabored, no coughing  Heart:  regular rate and rhythm and no murmur, Appears well perfused  Extremities: No edema  Skin: Skin color, texture, turgor normal and no rashes or lesions on visualized portions of skin  Neurologic: No gross deficits   Labs:  Lab Orders  No laboratory test(s) ordered today    Assessment/Plan   Chronic cough: Improved, not at goal. - if cough returns, start ryaltris or flonase 2 sprays each nostril daily - can also use atrovent nasal spray 1-2 sprays 1-3 times daily if having a lot of post nasal drip - if no improvement, start omeprazole 20 mg daily - take 30 minutes prior to breakfast  Asthma-at goal During asthma flares/respiratory illness: start Symbicort 160 mcg 2 puffs twice daily and continue for 2 weeks or until symptoms resolve. Continue montelukast 10 mg once a day to help prevent cough and wheeze. Continue albuterol 2 puffs every 4 hours as needed for cough or wheeze.   Allergic rhinitis-at goal Allergen avoidnace towards grass pollen, weed pollen, ragweed, tree pollen, outdoor molds, dust mites, cat, dog; allergen avoidance as below Continue allergy injections per protocol, bring updated epipen to clinic. Take 2 antihistamines on days of allergy injections (2 zyrtec tablets or 2 claritin tablets).  Triamcinolone 0.01% to be used IF  local reaction occurs on arms Continue Zyrtec 10 mg once or twice a day for runny nose or itchy eyes.  Some examples of over the counter antihistamines include Zyrtec (cetirizine), Xyzal (levocetirizine), Allegra (fexofenadine), and Claritin (loratidine).  Continue Ryaltris 2 sprays each nostril twice daily as needed.  Saline nasal rinses as needed for nasal symptoms. Use this before any medicated nasal sprays for best result Atrovent nasal spray 1-2 sprays up to three times daily as needed; use less frequently if nasal mucosa becomes too dry  Allergic conjunctivitis-at goal Some over the counter eye drops include Pataday one drop in each eye once a day as needed for red, itchy eyes OR Zaditor one drop in each eye twice a day as needed for red itchy eyes.  Reflux-at goal Continue dietary lifestyle modifications as listed below Consider follow-up GI if not controlled  Call the clinic if this treatment plan is not working well for you  Follow up : 6 months sooner if needed It was a pleasure seeing you again in clinic today! Thank you for allowing me to participate in your care.  Other: allergy injection given in clinic today  Tonny Bollman, MD  [facility]

## 2022-09-15 ENCOUNTER — Encounter: Payer: Self-pay | Admitting: Internal Medicine

## 2022-09-15 ENCOUNTER — Other Ambulatory Visit: Payer: Self-pay

## 2022-09-15 ENCOUNTER — Ambulatory Visit: Payer: 59 | Admitting: Internal Medicine

## 2022-09-15 VITALS — BP 112/70 | HR 85 | Temp 97.9°F | Resp 20 | Wt 169.8 lb

## 2022-09-15 DIAGNOSIS — K219 Gastro-esophageal reflux disease without esophagitis: Secondary | ICD-10-CM

## 2022-09-15 DIAGNOSIS — J3089 Other allergic rhinitis: Secondary | ICD-10-CM | POA: Diagnosis not present

## 2022-09-15 DIAGNOSIS — J302 Other seasonal allergic rhinitis: Secondary | ICD-10-CM | POA: Diagnosis not present

## 2022-09-15 DIAGNOSIS — H1013 Acute atopic conjunctivitis, bilateral: Secondary | ICD-10-CM

## 2022-09-15 DIAGNOSIS — J4530 Mild persistent asthma, uncomplicated: Secondary | ICD-10-CM | POA: Diagnosis not present

## 2022-09-15 DIAGNOSIS — J301 Allergic rhinitis due to pollen: Secondary | ICD-10-CM

## 2022-09-15 MED ORDER — RYALTRIS 665-25 MCG/ACT NA SUSP: 2.0000 | Freq: Two times a day (BID) | NASAL | 4 refills | Status: AC | PRN

## 2022-09-15 NOTE — Progress Notes (Signed)
Additional labels needed on 09/15/22

## 2022-09-15 NOTE — Patient Instructions (Addendum)
Chronic cough:  - if cough returns, start ryaltris or flonase 2 sprays each nostril daily - can also use atrovent nasal spray 1-2 sprays 1-3 times daily if having a lot of post nasal drip - if no improvement, start omeprazole 20 mg daily - take 30 minutes prior to breakfast  Asthma During asthma flares/respiratory illness: start Symbicort 160 mcg 2 puffs twice daily and continue for 2 weeks or until symptoms resolve. Continue montelukast 10 mg once a day to help prevent cough and wheeze. Continue albuterol 2 puffs every 4 hours as needed for cough or wheeze.   Allergic rhinitis Allergen avoidnace towards grass pollen, weed pollen, ragweed, tree pollen, outdoor molds, dust mites, cat, dog; allergen avoidance as below Continue allergy injections per protocol, bring updated epipen to clinic. Take 2 antihistamines on days of allergy injections (2 zyrtec tablets or 2 claritin tablets).  Triamcinolone 0.01% to be used IF local reaction occurs on arms Continue Zyrtec 10 mg once or twice a day for runny nose or itchy eyes.  Some examples of over the counter antihistamines include Zyrtec (cetirizine), Xyzal (levocetirizine), Allegra (fexofenadine), and Claritin (loratidine).  Continue Ryaltris 2 sprays each nostril twice daily as needed.  Saline nasal rinses as needed for nasal symptoms. Use this before any medicated nasal sprays for best result Atrovent nasal spray 1-2 sprays up to three times daily as needed; use less frequently if nasal mucosa becomes too dry  Allergic conjunctivitis Some over the counter eye drops include Pataday one drop in each eye once a day as needed for red, itchy eyes OR Zaditor one drop in each eye twice a day as needed for red itchy eyes.  Reflux- Continue dietary lifestyle modifications as listed below Consider follow-up GI if not controlled  Call the clinic if this treatment plan is not working well for you  Follow up : 6 months sooner if needed It was a pleasure  seeing you again in clinic today! Thank you for allowing me to participate in your care.  Tonny Bollman, MD [facility]  CHRONIC THROAT CLEARING-WHAT TO DO!! Causes of chronic throat clearing may include the following (among others):  Acid reflux (lanyngopharyngeal reflux) Allergies (pollens, pet dander, dust mites, etc) Non allergic rhinitis (runny nose, post nasal drainage or congestion NOT caused by allergies-can be secondary to pollutants, cold air, changes in blood vessels to the nose as we age,etc) Environmental irritants (tobacco, smoke, air pollution) Asthma If present for a long period of time, throat clearing can become a habit. We will work with you to treat any of the medical reasons for throat clearing.  Your job is to help prevent the habit, which can cause damage (redness and swelling) to your vocal cords.  It will require a conscious effort on your behalf.  Tips for prevention of throat clearing:  Instead of clearing your throat, swallow instead.   Carrying around water (or something to drink) will help you move the mucus in the right direction.  IF you have the urge to clear your throat, drink your water. If you absolutely have to clear your throat, use a non-traumatic exercise to do so.   Pant with your mouth open saying "[place], [place], [place]" with a powerful, breathy voice. Increase water intake.  This thins secretions, making them easier to swallow. Chew baking soda gum ([employer]) which can help with swallowing, reflux, and throat clearing.  Try to chew up to three times daily.   If you  experience jaw pain or headaches, decrease the amount of chewing. Suck on sugar free hard candy to help with swallowing. Have your friends and family remind you to swallow when they hear you throat clearing.  As this can be habit forming, sometimes you may not realize you are doing this.  Having someone point it out to you, will help you become more conscious of the  behavior. BE PATIENT.  This will take time to resolve, and some do not see improvement until 8-12 weeks into therapy/behavior modifications.

## 2022-09-21 IMAGING — CT CT MAXILLOFACIAL W/O CM
3 series · 16 of 47 positions shown, 19 images · non-contrast
Comparison: None.

CLINICAL DATA: Pt reports cough , fever x 4 days . Tested negative
for flu and rapid covid 2 days ago . Nausea and vomiting. Chest pain
when coughing and shortness of breath recurrent sinus infections



[Series 2: max soft · axial · 0.33mm/px · z∈[-186,-48]mm · 10 of 81 slices shown, 13 images]
[im 6/81  brain]
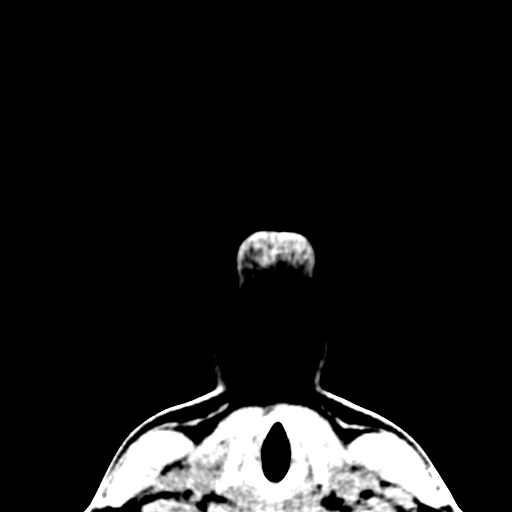
[im 6/81  bone]
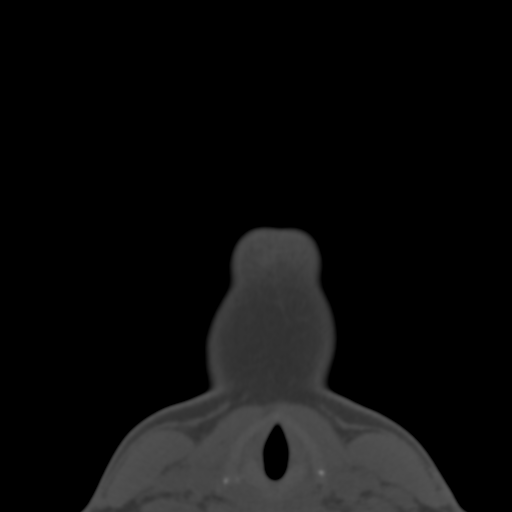
[im 14/81  bone]
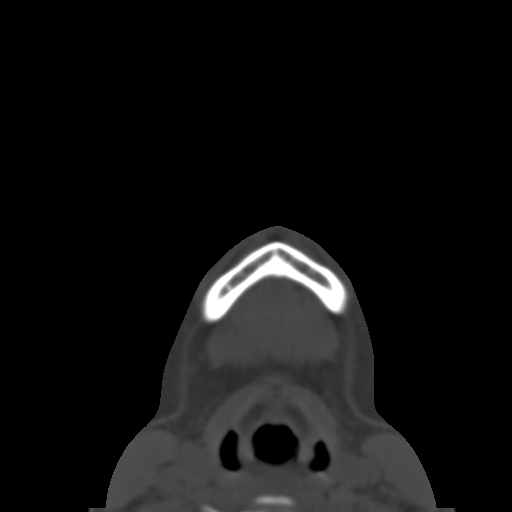
[im 23/81  bone]
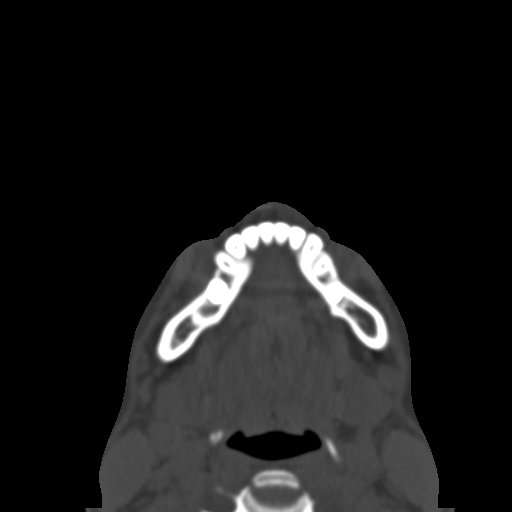
[im 28/81  bone]
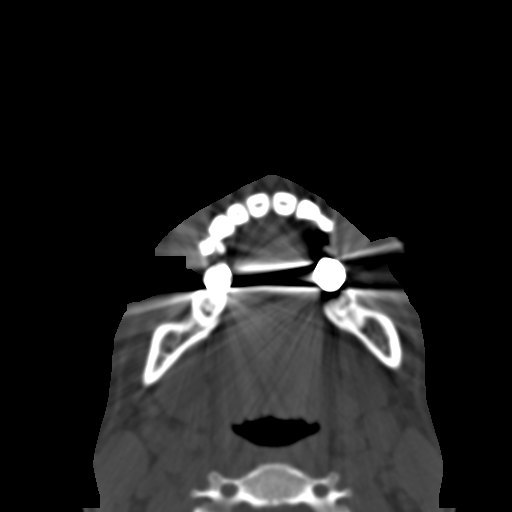
[im 36/81  brain]
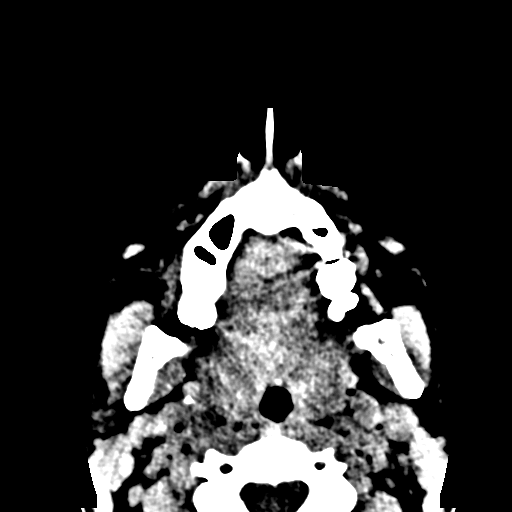
[im 36/81  bone]
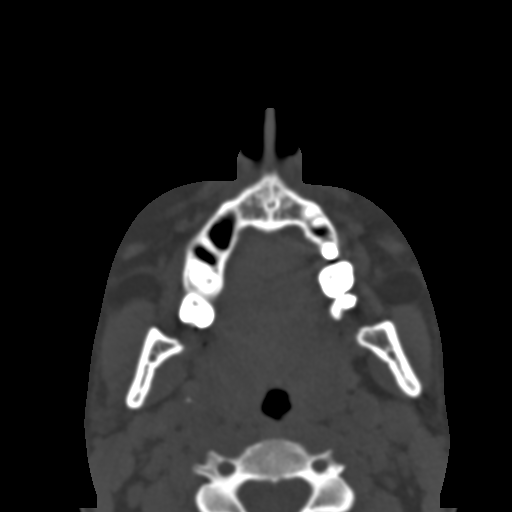
[im 45/81  bone]
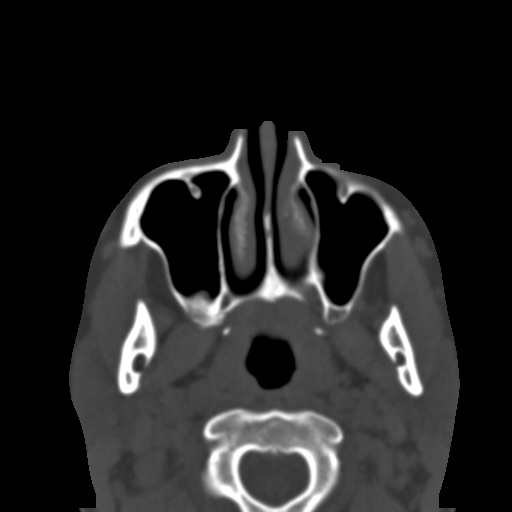
[im 53/81  bone]
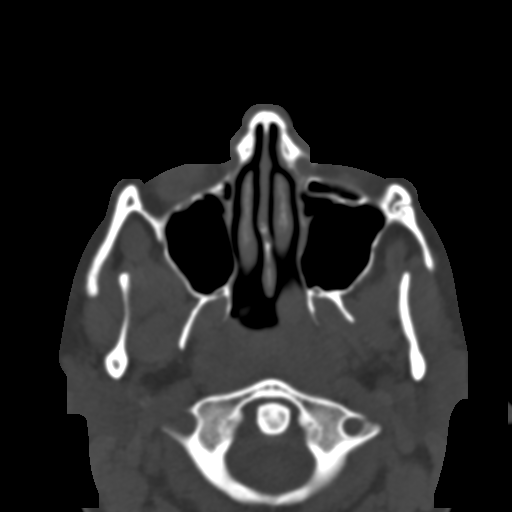
[im 61/81  bone]
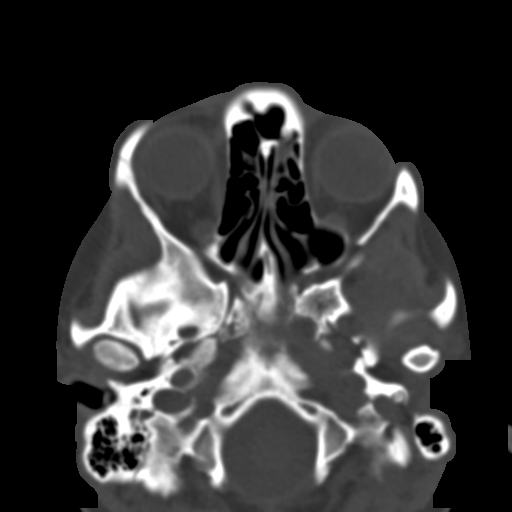
[im 67/81  brain]
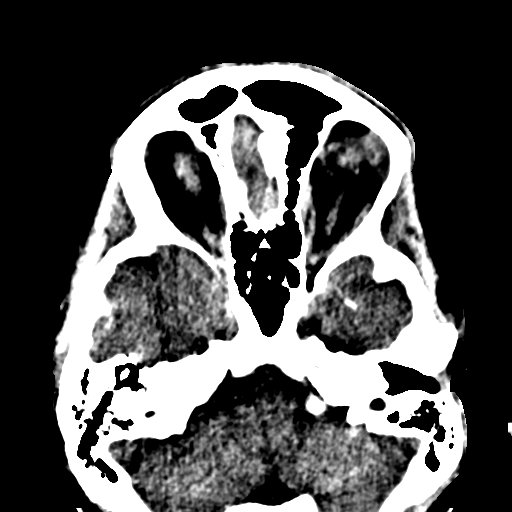
[im 67/81  bone]
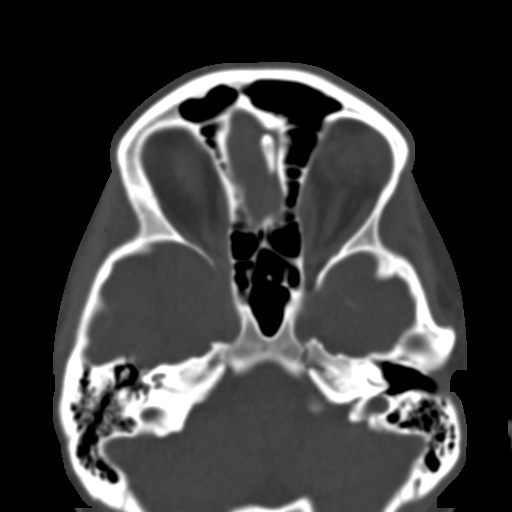
[im 75/81  bone]
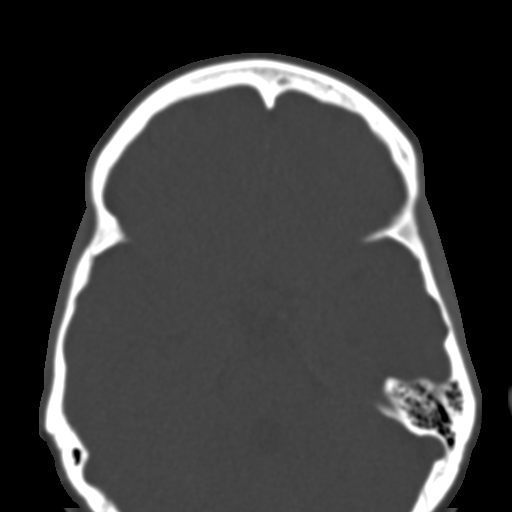

[Series 6: coronal soft · coronal · 0.32mm/px · 3 of 72 slices shown]
[im 24/72  bone]
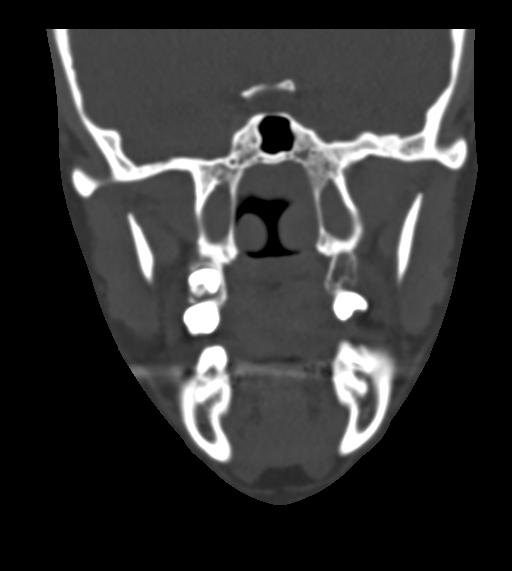
[im 32/72  bone]
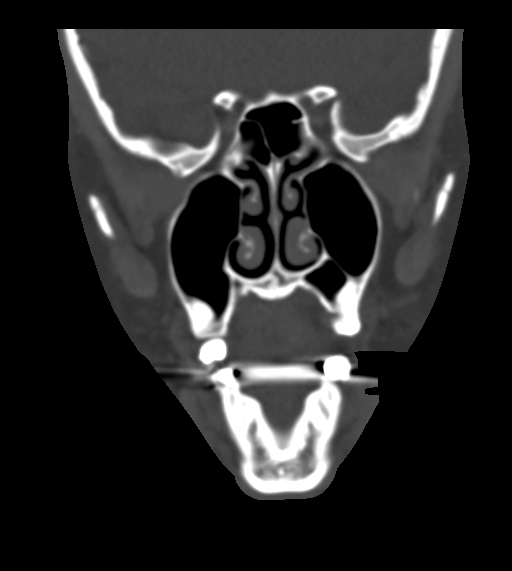
[im 40/72  bone]
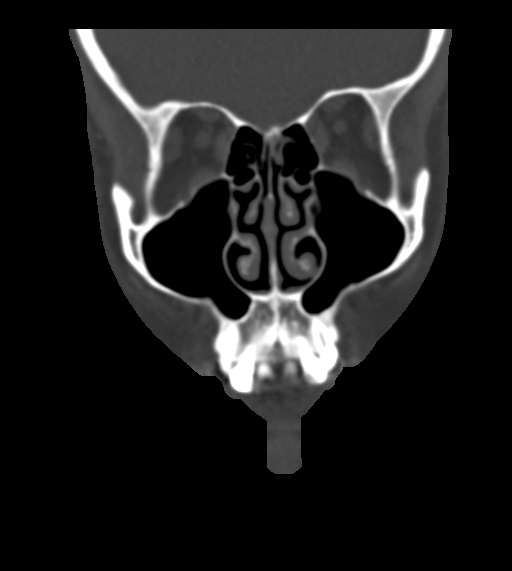

[Series 7: sagittal soft · sagittal · 0.32mm/px · 3 of 77 slices shown]
[im 26/77  bone]
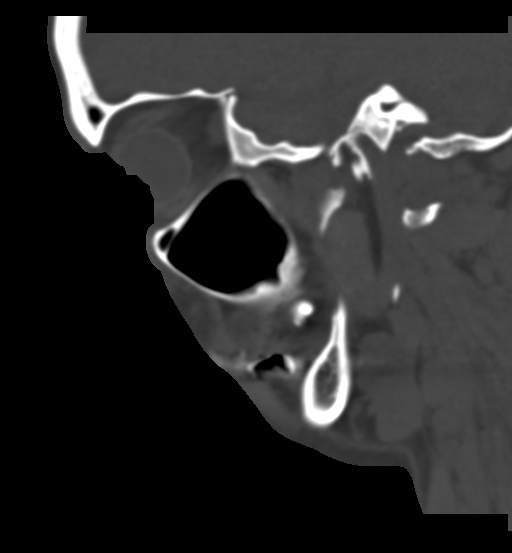
[im 39/77  bone]
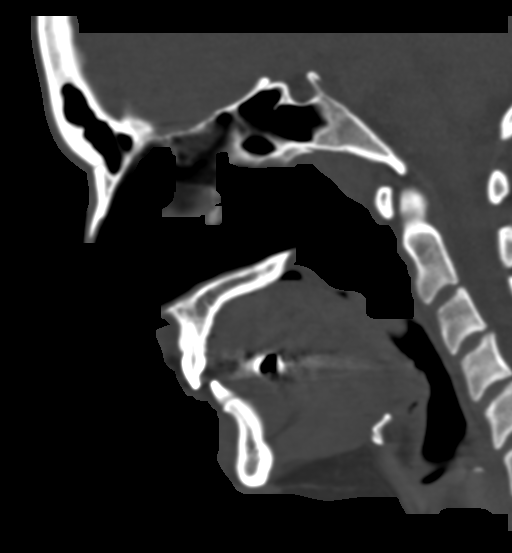
[im 51/77  bone]
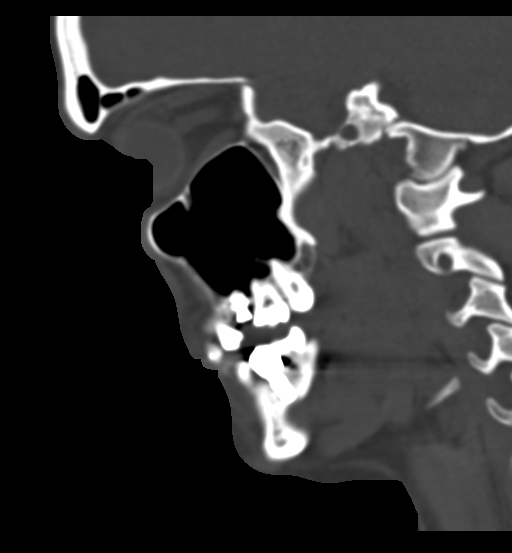

[16 of 47 positions shown; findings below may reference images not displayed]

FINDINGS: Osseous: No fracture or mandibular dislocation. No destructive
process.

Orbits: Negative. No traumatic or inflammatory finding.

Sinuses: Clear. No mucosal inflammation. No fluid. Ostiomeatal
complexes are patent. Frontal sinuses clear.

Soft tissues: Negative.

Limited intracranial: No significant or unexpected finding.
IMPRESSION: 1. No sinus inflammation.
2. Normal maxillofacial CT

## 2022-09-23 IMAGING — CT CT NECK W/ CM
3 of 4 series · 12 of 33 positions shown, 14 images · IV contrast (agent unspecified)
Comparison: None.

CLINICAL DATA: Epiglottitis or tonsillitis suspected

EXAM:
CT NECK WITH CONTRAST
TECHNIQUE: Multidetector CT imaging of the neck was performed using the
standard protocol following the bolus administration of intravenous
contrast.

[Series 6: sag neck · sagittal · 0.46mm/px · 5 of 83 slices shown, 6 images]
[im 28/83  bone]
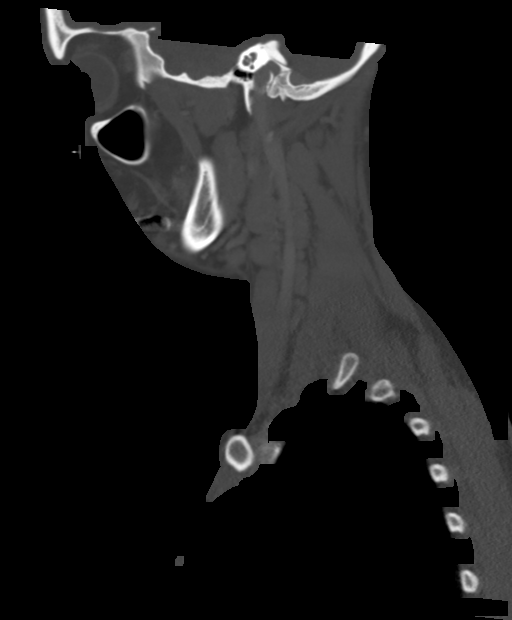
[im 35/83  bone]
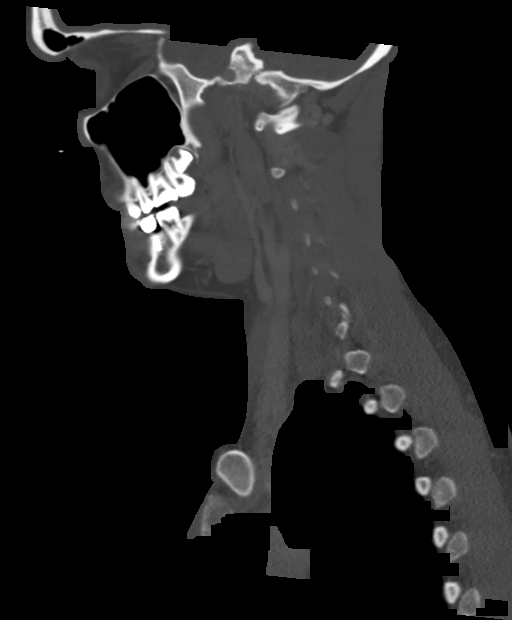
[im 42/83  soft-tissue]
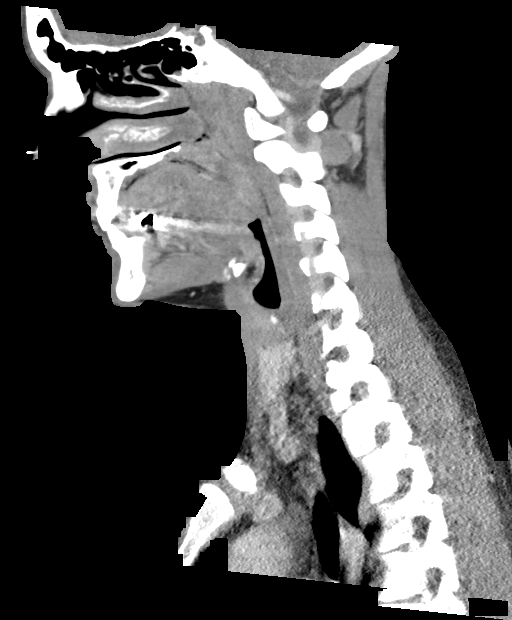
[im 42/83  bone]
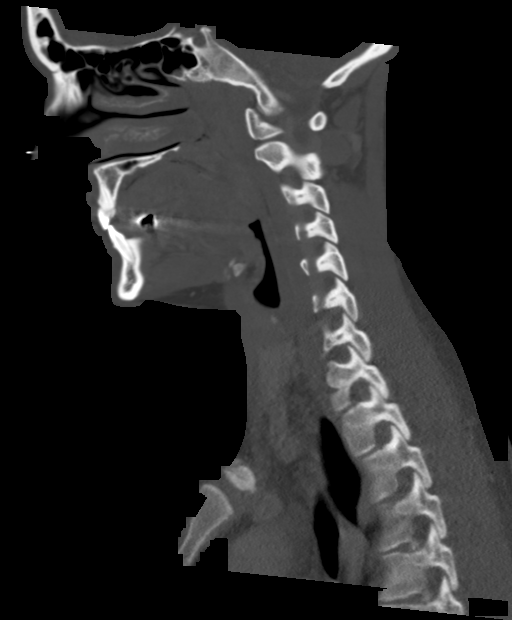
[im 48/83  bone]
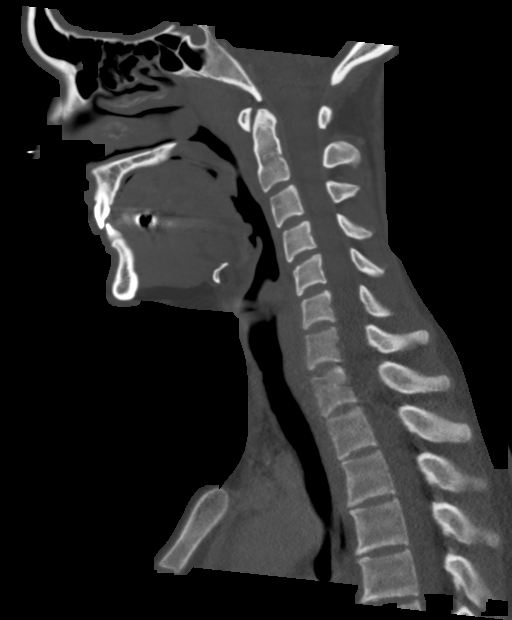
[im 55/83  bone]
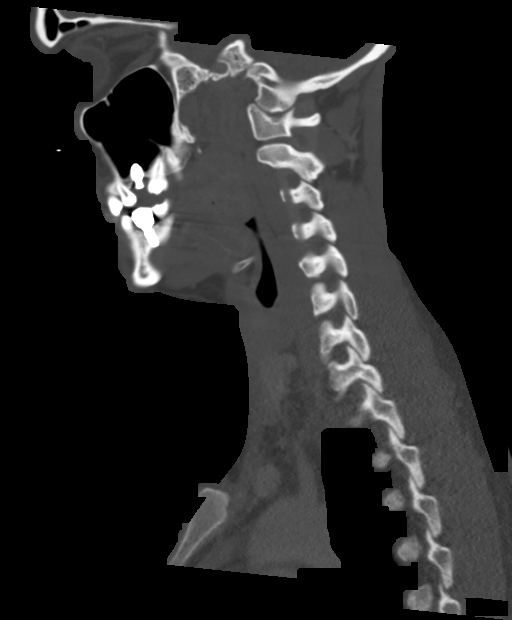

[Series 7: cor neck · coronal · 0.34mm/px · 3 of 124 slices shown]
[im 25/124  bone]
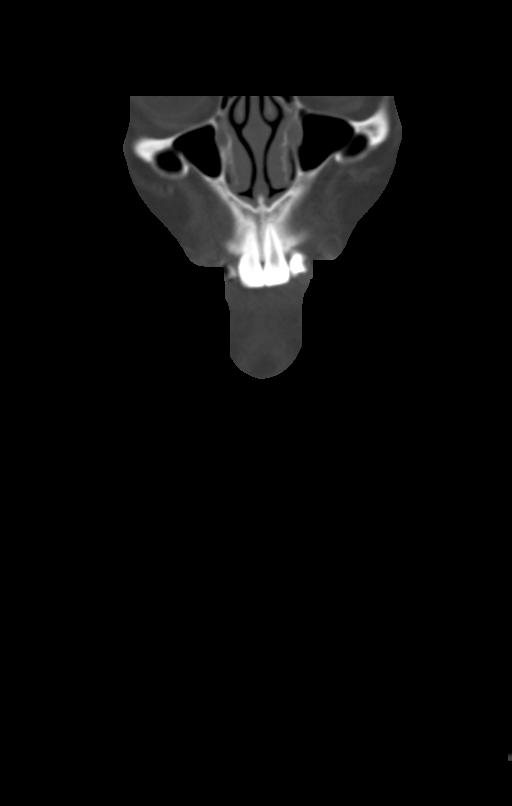
[im 50/124  bone]
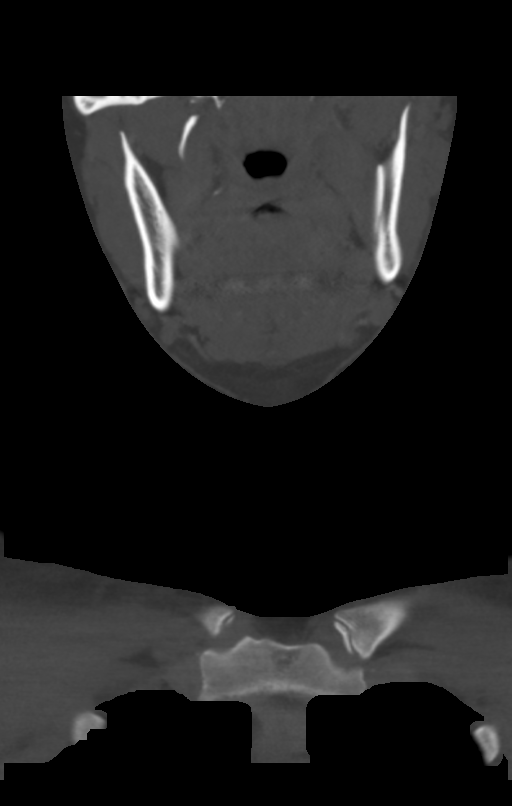
[im 74/124  bone]
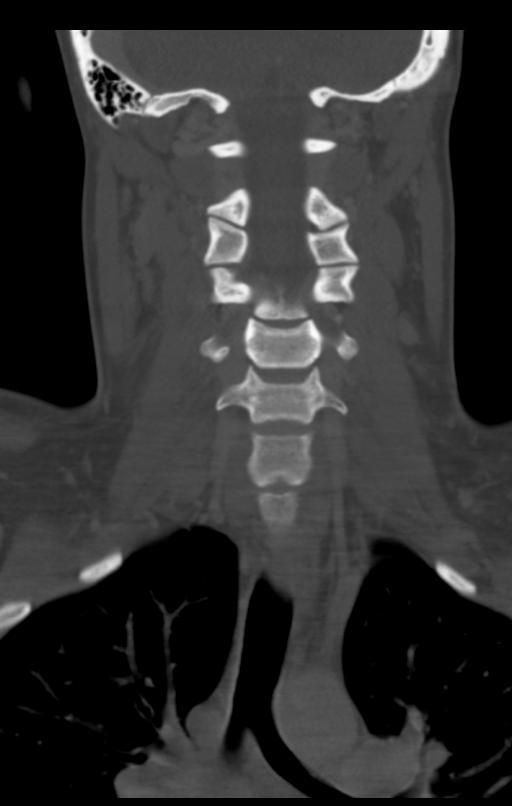

[Series 8: ax oropharynx · axial · 0.45mm/px · z∈[+626,+809]mm · 4 of 140 slices shown, 5 images]
[im 24/140  soft-tissue]
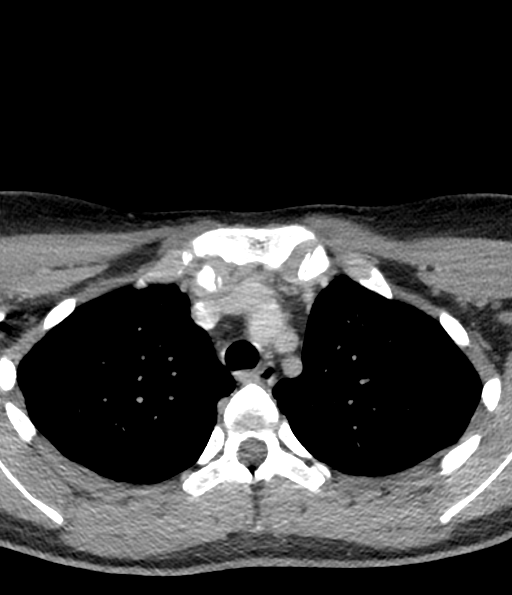
[im 24/140  bone]
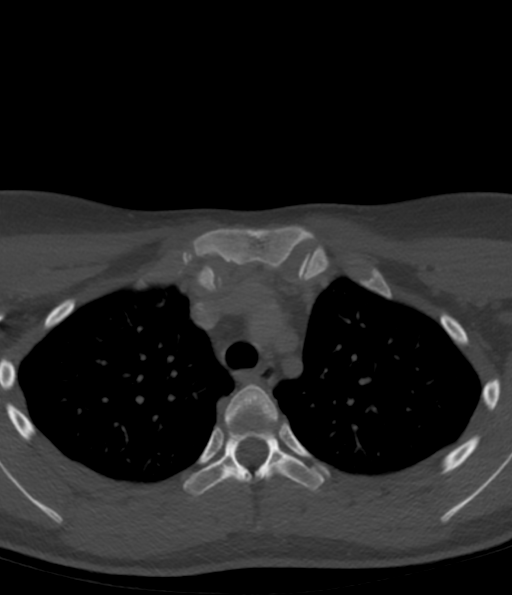
[im 47/140  bone]
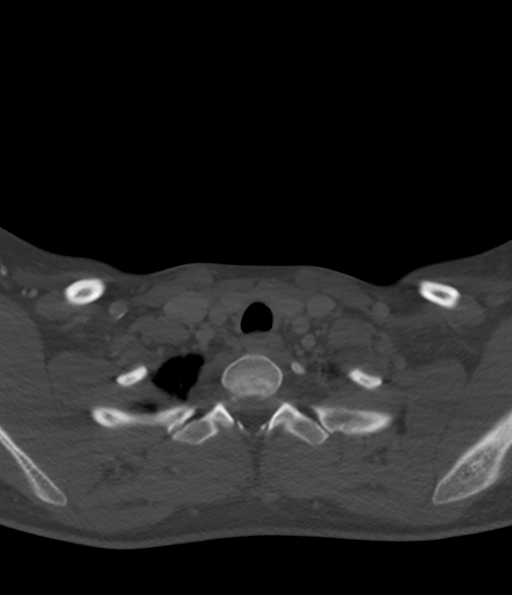
[im 93/140  bone]
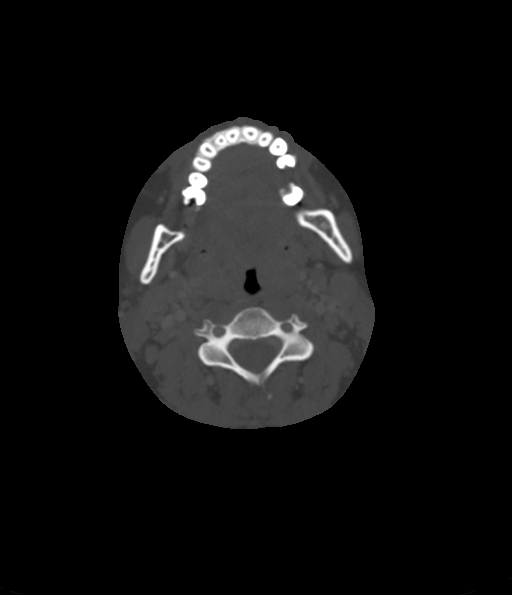
[im 116/140  bone]
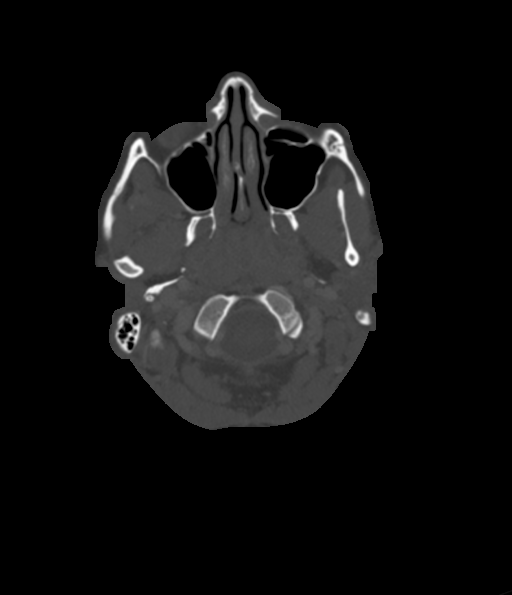

[12 of 33 positions shown; findings below may reference images not displayed]

RADIATION DOSE REDUCTION: This exam was performed according to the
departmental dose-optimization program which includes automated
exposure control, adjustment of the mA and/or kV according to
patient size and/or use of iterative reconstruction technique.

CONTRAST:  75mL OMNIPAQUE IOHEXOL 300 MG/ML  SOLN
FINDINGS: Pharynx and larynx: Enlarged/edematous palatine tonsils with
striated enhancement, compatible with tonsillitis. Adenoid
hypertrophy. Normal appearance of the epiglottis and larynx.

Salivary glands: No inflammation, mass, or stone.

Thyroid: Normal.

Lymph nodes: Enlarged upper cervical chain lymph nodes bilaterally.

Vascular: Limited evaluation due to non arterial timing. Major
arteries appear to be grossly patent in the neck.

Limited intracranial: Negative.

Visualized orbits: Negative.

Mastoids and visualized paranasal sinuses: Clear.

Skeleton: No evidence of acute abnormality.

Upper chest: Visualized lung apices are clear.
IMPRESSION: 1. Enlarged/edematous palatine tonsils with striated enhancement,
compatible with tonsillitis. No discrete, drainable fluid
collection.
2. Large upper cervical chain nodes bilaterally, nonspecific but
probably reactive given above findings.

## 2022-09-29 ENCOUNTER — Telehealth: Payer: Self-pay

## 2022-09-29 NOTE — Telephone Encounter (Signed)
[place] from [place] requesting next set of vials. Paper work given to [employer] in injection room.

## 2022-10-12 NOTE — Progress Notes (Signed)
VIALS EXP 10-12-23

## 2022-10-31 ENCOUNTER — Ambulatory Visit: Payer: 59

## 2022-11-01 ENCOUNTER — Ambulatory Visit (INDEPENDENT_AMBULATORY_CARE_PROVIDER_SITE_OTHER): Payer: 59

## 2022-11-01 DIAGNOSIS — J309 Allergic rhinitis, unspecified: Secondary | ICD-10-CM

## 2022-11-01 NOTE — Progress Notes (Signed)
Immunotherapy   Patient Details  Name: Christine Diaz MRN: [id number] Date of Birth: 10-13-2000  11/01/2022  Juliane Poot vials #1 Red 1:100 ( G-W-T-D and M-DM-C) Following schedule: A  Frequency:1 time per week Epi-Pen:Epi-Pen Available  Consent signed and patient instructions given. Pt's vials are being mailed to [facility] [address] [place] [id number] (per patient)   Janie Morning 11/01/2022, 10:24 AM

## 2022-11-29 ENCOUNTER — Telehealth: Payer: Self-pay

## 2022-11-29 ENCOUNTER — Other Ambulatory Visit: Payer: Self-pay

## 2022-11-29 NOTE — Telephone Encounter (Signed)
Adam from [employer] [facility] student [facility] called in reference to Pt having a large local reactions from her last dose of 0.05 RED, which was a repeat does from her being late. Pt report a 10 mm redness and wheal after shot was given, redness spread on 2/3 off upper arm after few days. After speaking with Dr. Marlynn Perking in office, per Dr. Christie Beckers was advice to have pt repeat last dose and double up on her antihistamine and use an ice pack.

## 2022-11-30 NOTE — Telephone Encounter (Signed)
Sounds good

## 2023-01-18 ENCOUNTER — Ambulatory Visit (INDEPENDENT_AMBULATORY_CARE_PROVIDER_SITE_OTHER): Payer: 59

## 2023-01-18 DIAGNOSIS — J309 Allergic rhinitis, unspecified: Secondary | ICD-10-CM | POA: Diagnosis not present

## 2023-02-02 ENCOUNTER — Ambulatory Visit (INDEPENDENT_AMBULATORY_CARE_PROVIDER_SITE_OTHER): Payer: 59

## 2023-02-02 DIAGNOSIS — J309 Allergic rhinitis, unspecified: Secondary | ICD-10-CM | POA: Diagnosis not present

## 2023-02-06 ENCOUNTER — Ambulatory Visit: Payer: 59 | Admitting: *Deleted

## 2023-02-06 DIAGNOSIS — J309 Allergic rhinitis, unspecified: Secondary | ICD-10-CM | POA: Diagnosis not present

## 2023-02-06 NOTE — Progress Notes (Signed)
 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  Following schedule: A  Frequency:1 time per week Epi-Pen:Epi-Pen Available  Vials and paperwork given.    Christine Diaz 02/06/2023, 11:32 AM

## 2023-03-17 ENCOUNTER — Ambulatory Visit: Payer: 59 | Admitting: Internal Medicine

## 2023-04-03 ENCOUNTER — Ambulatory Visit: Admitting: Internal Medicine

## 2023-04-03 ENCOUNTER — Encounter: Payer: Self-pay | Admitting: Internal Medicine

## 2023-04-03 VITALS — BP 110/76 | HR 62 | Temp 98.1°F | Resp 16

## 2023-04-03 DIAGNOSIS — R053 Chronic cough: Secondary | ICD-10-CM

## 2023-04-03 DIAGNOSIS — J3089 Other allergic rhinitis: Secondary | ICD-10-CM

## 2023-04-03 DIAGNOSIS — H1013 Acute atopic conjunctivitis, bilateral: Secondary | ICD-10-CM | POA: Diagnosis not present

## 2023-04-03 DIAGNOSIS — K219 Gastro-esophageal reflux disease without esophagitis: Secondary | ICD-10-CM | POA: Diagnosis not present

## 2023-04-03 DIAGNOSIS — J329 Chronic sinusitis, unspecified: Secondary | ICD-10-CM

## 2023-04-03 DIAGNOSIS — J302 Other seasonal allergic rhinitis: Secondary | ICD-10-CM

## 2023-04-03 DIAGNOSIS — J4530 Mild persistent asthma, uncomplicated: Secondary | ICD-10-CM | POA: Diagnosis not present

## 2023-04-03 NOTE — Patient Instructions (Addendum)
 Asthma, controlled  During asthma flares/respiratory illness: start Symbicort 160 mcg 2 puffs twice daily and continue for 2 weeks or until symptoms resolve. Continue montelukast 10 mg once a day to help prevent cough and wheeze. Continue albuterol 2 puffs every 4 hours as needed for cough or wheeze.   Allergic rhinitis, improved on injections  Allergen avoidnace towards grass pollen, weed pollen, ragweed, tree pollen, outdoor molds, dust mites, cat, dog; allergen avoidance as below Continue allergy injections per protocol, carry epipen on injection days  Take 2 antihistamines on days of allergy injections (2 zyrtec tablets or 2 claritin tablets).  Triamcinolone 0.01% to be used IF local reaction occurs on arms Continue Zyrtec 10 mg once or twice a day for runny nose or itchy eyes.  Some examples of over the counter antihistamines include Zyrtec (cetirizine), Xyzal (levocetirizine), Allegra (fexofenadine), and Claritin (loratidine).  Continue Ryaltris 2 sprays each nostril twice daily as needed.  Saline nasal rinses as needed for nasal symptoms. Use this before any medicated nasal sprays for best result   Allergic conjunctivitis Some over the counter eye drops include Pataday one drop in each eye once a day as needed for red, itchy eyes OR Zaditor one drop in each eye twice a day as needed for red itchy eyes.  Reflux- controlled  Continue dietary lifestyle modifications as listed below Consider follow-up GI if not controlled  Call the clinic if this treatment plan is not working well for you  Follow up: 12 months   Thank you so much for letting me partake in your care today.  Don't hesitate to reach out if you have any additional concerns!  Ferol Luz, MD  [facility] [place] [place]

## 2023-04-03 NOTE — Progress Notes (Signed)
 Immunotherapy   Patient Details  Name: Christine Diaz MRN: [id number] Date of Birth: 2000/09/27  04/03/2023  Christine Diaz here to pick up #1 Red 1:100 (M-DM-C and G-W-T-D) Following schedule: A  Frequency:1 time per week Epi-Pen:Epi-Pen Available  Consent signed and patient instructions given. Patient is getting her injections at [facility].   Karstyn Birkey J Janace Decker 04/03/2023, 11:35 AM

## 2023-04-03 NOTE — Progress Notes (Signed)
 FOLLOW UP Date of Service/Encounter:  04/03/23  Subjective:  Christine Diaz (DOB: 10/14/2000) is a 23 y.o. female [facility] in re-evaluation of the following: chronic cough, asthma, allergic rhinitis on AIT History obtained from: chart review and patient.  For Review, LV was on 09/15/22  with Dr.Dennis seen for routine follow-up. See below for summary of history and diagnostics.   ----------------------------------------------------- Pertinent History/Diagnostics:  Asthma:  controlled with Symbicort 160, 2 puffs BID and montelukast - CXR 04/12/21: no acute findings. Allergic Rhinitis:  has hx of reflux, getting 3-4 sinus infections per year requiring antibiotics - SPT environmental panel 02/19/2018 - positive to grass pollen, weed pollen, tree pollen, cat, dog, and dust mite - CT soft tissue neck (04/14/21) -Enlarged/edematous palatine tonsils with striated enhancement, compatible with tonsillitis. Adenoid hypertrophy. Normal appearance of the epiglottis and larynx. Large upper cervical chain nodes bilaterally, nonspecific but probably reactive given above findings. - CT sinus: 04/12/21: 1. No sinus inflammation. 2. Normal maxillofacial CT Reflux:   - Upper GI: 03/06/20: 1. A single bout of reflux into the mid esophagus was identified. 2. The esophagus, stomach, and duodenum are otherwise normal. 3. Visualization of the terminal ileum is somewhat suboptimal due to its location deep in the right side of the pelvis. However, it was visualized and grossly normal in appearance. The remainder of the  small bowel is unremarkable.  -ENT on 02/23/21: no polyps found; DX: migraine variant vs sinus infection, no FU scheduled. FU visit following tonsillitis on 05/06/21: tonsillectomy offered - 07/05/21 SPT: positive to grass pollen, weed pollen, ragweed, tree pollen, outdoor molds, dust mites, cat, dog  - 07/05/22: CT sinus: IMPRESSION: Normally  aerated paranasal sinuses. Patent sinus drainage pathways. - [facility] AIT started 08/14/21, receives injections on campus at [facility] --------------------------------------------------- Today presents for follow-up. Discussed the use of AI scribe software for clinical note transcription with the patient, who gave verbal consent to proceed.  History of Present Illness        She is receiving allergy shots at college and notes that sometimes the injection site becomes very large and painful, particularly when administered by one of the nurses. The pain is more pronounced when the shot is not administered correctly into the fatty tissue.  With the onset of warmer weather, she experiences a return of allergy symptoms, including a runny and stuffy nose, but not itchy or watery eyes. She has a history of worsening symptoms in the presence of her cat and dog at home. She believes the allergy shots have provided some improvement. Sinus infections have significantly improved in frequency.  She has no needed any ABX for sinus infection since last visit.   She experienced a recent episode of severe throat pain while at home with her pets, which led to an urgent care visit. She tested negative for infections, but her uvula was noted to be swollen and red. She was treated with a steroid, which resolved the symptoms by the next day.  She is currently alternating between Zyrtec and Claritin every few months, taking montelukast and uses a nasal spray daily, administering one spray per nostril. She denies tasting the nasal spray medication, indicating proper usage. She has been prescribed fluticasone and Ryaltris nasal sprays but has only been using the fluticasone. No recent use of her Symbicort inhaler and reports asthma is well controlled.  Rare albuterol use.    She has a history of reflux, which is currently managed with dietary  modifications and is not on medication for it. Her reflux is under control     All  medications reviewed by clinical staff and updated in chart. No new pertinent medical or surgical history except as noted in HPI.  ROS: All others negative except as noted per HPI.   Objective:  BP 110/76   Pulse 62   Temp 98.1 F (36.7 C) (Temporal)   Resp 16   SpO2 100%  There is no height or weight on file to calculate BMI. Physical Exam: General Appearance:  Alert, cooperative, no distress, appears stated age  Head:  Normocephalic, without obvious abnormality, atraumatic  Eyes:  Conjunctiva clear, EOM's intact  Ears EACs normal bilaterally  Nose: Nares normal,  erythematous nasal mucosa with scant clear rhinnorhea , no visible anterior polyps, and septum midline  Throat: Lips, tongue normal; teeth and gums normal, normal posterior oropharynx  Neck: Supple, symmetrical  Lungs:   clear to auscultation bilaterally, Respirations unlabored, no coughing  Heart:  regular rate and rhythm and no murmur, Appears well perfused  Extremities: No edema  Skin: Skin color, texture, turgor normal and no rashes or lesions on visualized portions of skin  Neurologic: No gross deficits   Labs:  Lab Orders  No laboratory test(s) ordered today     Assessment/Plan   Asthma, controlled  During asthma flares/respiratory illness: start Symbicort 160 mcg 2 puffs twice daily and continue for 2 weeks or until symptoms resolve. Continue montelukast 10 mg once a day to help prevent cough and wheeze. Continue albuterol 2 puffs every 4 hours as needed for cough or wheeze.   Allergic rhinitis, improved on injections  Allergen avoidnace towards grass pollen, weed pollen, ragweed, tree pollen, outdoor molds, dust mites, cat, dog; allergen avoidance as below Continue allergy injections per protocol, carry epipen on injection days  Take 2 antihistamines on days of allergy injections (2 zyrtec tablets or 2 claritin tablets).  Triamcinolone 0.01% to be used IF local reaction occurs on arms Continue Zyrtec 10  mg once or twice a day for runny nose or itchy eyes.  Some examples of over the counter antihistamines include Zyrtec (cetirizine), Xyzal (levocetirizine), Allegra (fexofenadine), and Claritin (loratidine).  Continue Ryaltris 2 sprays each nostril twice daily as needed.  Saline nasal rinses as needed for nasal symptoms. Use this before any medicated nasal sprays for best result   Allergic conjunctivitis Some over the counter eye drops include Pataday one drop in each eye once a day as needed for red, itchy eyes OR Zaditor one drop in each eye twice a day as needed for red itchy eyes.  Reflux- controlled  Continue dietary lifestyle modifications as listed below Consider follow-up GI if not controlled  Call the clinic if this treatment plan is not working well for you  Follow up: 12 months   Other:  Vials picked up for transport to [facility]   Thank you so much for letting me partake in your care today.  Don't hesitate to reach out if you have any additional concerns!  Ferol Luz, MD  [facility] [place] [place]

## 2023-04-05 ENCOUNTER — Ambulatory Visit: Payer: 59 | Admitting: Internal Medicine

## 2023-04-28 ENCOUNTER — Telehealth: Payer: Self-pay

## 2023-04-28 NOTE — Telephone Encounter (Signed)
 Spoke with Leta Jungling from [facility]. She stated, that patient has not had an injection since 03/27/23 and wanted to know what dose to give her. Per Dr. Maurine Minister, give 0.025 cc at next appointment on 05/01/23. Faxed over note signed by Dr. Maurine Minister stating what dose to administer.

## 2023-06-19 ENCOUNTER — Ambulatory Visit (INDEPENDENT_AMBULATORY_CARE_PROVIDER_SITE_OTHER): Payer: Self-pay

## 2023-06-19 ENCOUNTER — Ambulatory Visit

## 2023-06-19 DIAGNOSIS — J309 Allergic rhinitis, unspecified: Secondary | ICD-10-CM

## 2023-09-13 ENCOUNTER — Telehealth: Payer: Self-pay

## 2023-09-13 NOTE — Telephone Encounter (Signed)
 Patient called needing vials for college.  Last dose received here was 06-19-23 @ 0.3 both vials.  What vial would you like her to restart at?  She had some difficulty reaching the red vial.

## 2023-09-13 NOTE — Telephone Encounter (Signed)
 Patient is leaving for school early tomorrow will not be able to restart injections before going.  After school activities slow down she will call to set up appt to restart and get the first injection.

## 2023-09-13 NOTE — Progress Notes (Signed)
 VIALS MADE 09-13-23
